# Patient Record
Sex: Female | Born: 1950
Health system: Southern US, Community
[De-identification: ages and names within clinical notes are randomized; demographics above are authoritative.]

## PROBLEM LIST (undated history)

## (undated) DIAGNOSIS — R569 Unspecified convulsions: Secondary | ICD-10-CM

## (undated) DIAGNOSIS — G40209 Localization-related (focal) (partial) symptomatic epilepsy and epileptic syndromes with complex partial seizures, not intractable, without status epilepticus: Secondary | ICD-10-CM

## (undated) DIAGNOSIS — I639 Cerebral infarction, unspecified: Secondary | ICD-10-CM

## (undated) DIAGNOSIS — M1990 Unspecified osteoarthritis, unspecified site: Secondary | ICD-10-CM

## (undated) DIAGNOSIS — M79609 Pain in unspecified limb: Secondary | ICD-10-CM

## (undated) DIAGNOSIS — G479 Sleep disorder, unspecified: Secondary | ICD-10-CM

## (undated) DIAGNOSIS — I10 Essential (primary) hypertension: Secondary | ICD-10-CM

## (undated) HISTORY — DX: Essential (primary) hypertension: I10

## (undated) HISTORY — DX: Cerebral infarction, unspecified: I63.9

## (undated) HISTORY — PX: NO PAST SURGERIES: SHX2092

## (undated) HISTORY — DX: Unspecified convulsions: R56.9

## (undated) HISTORY — DX: Sleep disorder, unspecified: G47.9

## (undated) HISTORY — DX: Localization-related (focal) (partial) symptomatic epilepsy and epileptic syndromes with complex partial seizures, not intractable, without status epilepticus: G40.209

## (undated) HISTORY — DX: Pain in unspecified limb: M79.609

---

## 2001-03-22 ENCOUNTER — Inpatient Hospital Stay (HOSPITAL_COMMUNITY): Admission: EM | Admit: 2001-03-22 | Discharge: 2001-03-24 | Payer: Self-pay | Admitting: Emergency Medicine

## 2001-03-22 ENCOUNTER — Encounter: Payer: Self-pay | Admitting: Neurology

## 2004-04-10 ENCOUNTER — Ambulatory Visit: Payer: Self-pay | Admitting: Internal Medicine

## 2012-05-15 ENCOUNTER — Telehealth: Payer: Self-pay | Admitting: Nurse Practitioner

## 2012-05-23 ENCOUNTER — Telehealth: Payer: Self-pay | Admitting: Neurology

## 2012-05-29 NOTE — Telephone Encounter (Signed)
error 

## 2012-07-27 ENCOUNTER — Encounter: Payer: Self-pay | Admitting: Neurology

## 2012-07-27 ENCOUNTER — Ambulatory Visit (INDEPENDENT_AMBULATORY_CARE_PROVIDER_SITE_OTHER): Payer: 59 | Admitting: Neurology

## 2012-07-27 VITALS — BP 181/87 | HR 50 | Temp 98.0°F | Ht 65.0 in | Wt 203.0 lb

## 2012-07-27 DIAGNOSIS — G40909 Epilepsy, unspecified, not intractable, without status epilepticus: Secondary | ICD-10-CM

## 2012-07-27 MED ORDER — CARBAMAZEPINE ER 300 MG PO CP12: 600.0000 mg | ORAL_CAPSULE | Freq: Two times a day (BID) | ORAL | Status: DC

## 2012-07-27 MED ORDER — LEVETIRACETAM 750 MG PO TABS: 750.0000 mg | ORAL_TABLET | Freq: Two times a day (BID) | ORAL | Status: DC

## 2012-07-27 NOTE — Progress Notes (Signed)
Subjective:    Patient ID: Tracy Lawson is a 62 y.o. female.  HPI  Interim history:     Tracy Lawson is a very pleasant 62 year old right-handed woman who presents for followup consultation of her seizure disorder. She is unaccompanied today. She previously followed with Dr. Venia Carbon and was last seen by him on 09/14/2011, at which time he felt that she was stable and suggested a one-year followup. She developed seizures in 1993 after the birth of her first child. She was first seen at [facility] and he didn't, [place] and was placed on Dilantin but this caused gum swelling and she was switched to Tegretol. She had side effects on Tegretol including sleepiness and lightheadedness and poor Sz control. Her seizures as simple partial, complex partial and secondarily generalized seizures. Her last generalized event was in 1995 per Dr. Imagene Gurney report. Keppra was added in 2002. Her last MRI brain with and without contrast in March 2003 was normal. Last MR a brain from March 2003 was normal. Her last EEG was in September 2004. She has an underlying medical history of hypertension, hyperlipidemia and seizures. She has had no major surgeries. She's currently on spironolactone, atenolol, Norvasc, aspirin, clonidine, furosemide, Lipitor, Klor-Con, Keppra 750 mg twice daily calcium and vitamin D and Carbatrol 600 mg twice daily. She has no new complaints today. She denies any recent illness, aura, seizure, problems with her medication or side effects.  Her Past Medical History Is Significant For: Past Medical History  Diagnosis Date  . Pain in limb   . Sleep disturbance, unspecified   . Unspecified essential hypertension   . Localization-related (focal) (partial) epilepsy and epileptic syndromes with complex partial seizures, without mention of intractable epilepsy     Her Past Surgical History Is Significant For: History reviewed. No pertinent past surgical history.  Her Family History Is  Significant For: Family History  Problem Relation Age of Onset  . Stroke Father   . Heart failure Mother     Her Social History Is Significant For: History   Social History  . Marital Status: Married    Spouse Name: N/A    Number of Children: N/A  . Years of Education: N/A   Social History Main Topics  . Smoking status: Former Smoker    Types: Cigarettes    Quit date: 07/27/1992  . Smokeless tobacco: None  . Alcohol Use: No  . Drug Use: No  . Sexually Active: None   Other Topics Concern  . None   Social History Narrative  . None    Her Allergies Are:  No Known Allergies:   Her Current Medications Are:  Outpatient Encounter Prescriptions as of 07/27/2012  Medication Sig Dispense Refill  . amLODipine (NORVASC) 10 MG tablet Take 10 mg by mouth daily.      Marland Kitchen aspirin 81 MG tablet Take 81 mg by mouth daily.      Marland Kitchen atenolol (TENORMIN) 100 MG tablet Take 100 mg by mouth 2 (two) times daily.      Marland Kitchen atorvastatin (LIPITOR) 20 MG tablet Take 20 mg by mouth daily.      . Calcium Carbonate-Vitamin D (CALCIUM + D PO) Take 1 tablet by mouth daily.      . carbamazepine (CARBATROL) 300 MG 12 hr capsule       . cloNIDine (CATAPRES) 0.1 MG tablet Take 0.1 mg by mouth 2 (two) times daily.      . furosemide (LASIX) 80 MG tablet Take 80 mg  by mouth 2 (two) times daily.      Marland Kitchen levETIRAcetam (KEPPRA) 750 MG tablet Take 750 mg by mouth every 12 (twelve) hours.      . potassium chloride SA (K-DUR,KLOR-CON) 20 MEQ tablet Take 20 mEq by mouth daily.      Marland Kitchen spironolactone (ALDACTONE) 50 MG tablet Take 50 mg by mouth daily.       No facility-administered encounter medications on file as of 07/27/2012.  :   Review of Systems  Constitutional: Positive for unexpected weight change (gain).    Objective:  Neurologic Exam  Physical Exam Physical Examination:   Filed Vitals:   07/27/12 1447  BP: 181/87  Pulse: 50  Temp: 98 F (36.7 C)    General Examination: The patient is a very  pleasant 62 y.o. female in no acute distress. She appears well-developed and well-nourished and very well groomed.   HEENT: Normocephalic, atraumatic, pupils are equal, round and reactive to light and accommodation. Extraocular tracking is good without limitation to gaze excursion or nystagmus noted. Normal smooth pursuit is noted. Hearing is grossly intact. Face is symmetric with normal facial animation and normal facial sensation. Speech is clear with no dysarthria noted. There is no hypophonia. There is no lip, neck/head, jaw or voice tremor. Neck is supple with full range of passive and active motion. There are no carotid bruits on auscultation. Oropharynx exam reveals: mild mouth dryness, good dental hygiene and mild airway crowding. Mallampati is class II. Tongue protrudes centrally and palate elevates symmetrically.   Chest: Clear to auscultation without wheezing, rhonchi or crackles noted.  Heart: S1+S2+0, regular and normal without murmurs, rubs or gallops noted.   Abdomen: Soft, non-tender and non-distended with normal bowel sounds appreciated on auscultation.  Extremities: There is no pitting edema in the distal lower extremities bilaterally. Pedal pulses are intact.  Skin: Warm and dry without trophic changes noted. There are no varicose veins.  Musculoskeletal: exam reveals no obvious joint deformities, tenderness or joint swelling or erythema.   Neurologically:  Mental status: The patient is awake, alert and oriented in all 4 spheres. Her memory, attention, language and knowledge are appropriate. There is no aphasia, agnosia, apraxia or anomia. Speech is clear with normal prosody and enunciation. Thought process is linear. Mood is congruent and affect is normal.  Cranial nerves are as described above under HEENT exam. In addition, shoulder shrug is normal with equal shoulder height noted. Motor exam: Normal bulk, strength and tone is noted. There is no drift, tremor or rebound.  Romberg is negative. Reflexes are 2+ throughout. Toes are downgoing bilaterally. Fine motor skills are intact with normal finger taps, normal hand movements, normal rapid alternating patting, normal foot taps and normal foot agility.  Cerebellar testing shows no dysmetria or intention tremor on finger to nose testing. Heel to shin is unremarkable bilaterally. There is no truncal or gait ataxia.  Sensory exam is intact to light touch, pinprick, vibration, temperature sense and proprioception in the upper and lower extremities.  Gait, station and balance are unremarkable. No veering to one side is noted. No leaning to one side is noted. Posture is age-appropriate and stance is narrow based. No problems turning are noted. She turns en bloc. Tandem walk is unremarkable. Intact toe and heel stance is noted.               Assessment and Plan:  Assessment and Plan:  In summary, Tracy Lawson is a very pleasant 62 y.o.-year old female with  a history of seizure disorder, including complex partial, simple partial and secondary generalized seizures. She has been stable on a 2 AED regimen without SE. She is advised to follow up in 12 months with one of my associates. I explained to her that we are trying to get Dr. Imagene Gurney patients accommodated the best we can according to the patient's diagnoses. My field of expertise his movement disorders of Parkinson's disease and she understands that we all have different levels of expertise in different areas of neurology in our group. To that end we will have our nurse, Hermenia Fiscal, call her for her next appointment. I did suggest some blood work and renewed her prescriptions today.  I answered all her questions today and the patient was in agreement with the above outlined plan. She is encouraged to call with any interim questions, concerns, or problems.

## 2012-07-27 NOTE — Patient Instructions (Addendum)
I think overall you are doing fairly well and are stable at this point.   I do have some generic suggestions for you today:  Please make sure that you drink plenty of fluids. I would like for you to exercise daily for example in the form of walking 20-30 minutes every day, if you can. Please keep a regular sleep-wake schedule, keep regular meal times, do not skip any meals, eat  healthy snacks in between meals, such as fruit or nuts. Try to eat protein with every meal.   As far as your medications are concerned, I would like to suggest: no changes. I have renewed your prescriptions.    As far as diagnostic testing, I recommend: CBC and chemistry today. We will call you for tests results.   I do not think we need to make any changes in your medications at this point. I think you're stable enough that we can see you back in 6 months. We will have our nurse Hermenia Fiscal, call you to make her next appointment with one of my associates for your seizure disorder.  Our phone number is [phone]. We also have an after hours call service for urgent matters and there is a physician on-call for urgent questions. For any emergencies you know to call 911 or go to the nearest emergency room.

## 2012-07-28 LAB — CBC WITH DIFFERENTIAL
Basos: 0 % (ref 0–3)
Eos: 0 % (ref 0–5)
HCT: 38.4 % (ref 34.0–46.6)
Hemoglobin: 13.1 g/dL (ref 11.1–15.9)
Lymphocytes Absolute: 1.8 10*3/uL (ref 0.7–3.1)
Lymphs: 41 % (ref 14–46)
MCV: 88 fL (ref 79–97)
Monocytes: 10 % (ref 4–12)
Neutrophils Absolute: 2.2 10*3/uL (ref 1.4–7.0)
RDW: 13.5 % (ref 12.3–15.4)
WBC: 4.4 10*3/uL (ref 3.4–10.8)

## 2012-07-28 LAB — COMPREHENSIVE METABOLIC PANEL
Albumin/Globulin Ratio: 1.4 (ref 1.1–2.5)
Albumin: 4.2 g/dL (ref 3.6–4.8)
Alkaline Phosphatase: 115 IU/L (ref 39–117)
BUN/Creatinine Ratio: 17 (ref 11–26)
BUN: 15 mg/dL (ref 8–27)
Chloride: 103 mmol/L (ref 97–108)
Creatinine, Ser: 0.9 mg/dL (ref 0.57–1.00)
GFR calc Af Amer: 80 mL/min/{1.73_m2} (ref >59)
GFR calc non Af Amer: 69 mL/min/{1.73_m2} (ref >59)
Globulin, Total: 2.9 g/dL (ref 1.5–4.5)
Glucose: 95 mg/dL (ref 65–99)
Total Bilirubin: 0.2 mg/dL (ref 0.0–1.2)
Total Protein: 7.1 g/dL (ref 6.0–8.5)

## 2012-07-28 NOTE — Progress Notes (Signed)
Quick Note:  Please advise patient that her CBC and CMP were normal. ______

## 2012-07-31 NOTE — Progress Notes (Signed)
Quick Note:  Left message for patient with normal CBC and CMP, per [name]. ______

## 2012-08-02 ENCOUNTER — Telehealth: Payer: Self-pay | Admitting: Neurology

## 2012-12-18 DIAGNOSIS — Z0289 Encounter for other administrative examinations: Secondary | ICD-10-CM

## 2013-02-06 ENCOUNTER — Encounter: Payer: Self-pay | Admitting: Neurology

## 2013-02-06 ENCOUNTER — Encounter (INDEPENDENT_AMBULATORY_CARE_PROVIDER_SITE_OTHER): Payer: Self-pay

## 2013-02-06 ENCOUNTER — Ambulatory Visit (INDEPENDENT_AMBULATORY_CARE_PROVIDER_SITE_OTHER): Payer: 59 | Admitting: Neurology

## 2013-02-06 VITALS — BP 140/90 | HR 88 | Resp 17 | Ht 66.0 in | Wt 198.0 lb

## 2013-02-06 DIAGNOSIS — G40209 Localization-related (focal) (partial) symptomatic epilepsy and epileptic syndromes with complex partial seizures, not intractable, without status epilepticus: Secondary | ICD-10-CM

## 2013-02-06 DIAGNOSIS — G40309 Generalized idiopathic epilepsy and epileptic syndromes, not intractable, without status epilepticus: Secondary | ICD-10-CM | POA: Insufficient documentation

## 2013-02-06 DIAGNOSIS — G40109 Localization-related (focal) (partial) symptomatic epilepsy and epileptic syndromes with simple partial seizures, not intractable, without status epilepticus: Secondary | ICD-10-CM

## 2013-02-06 DIAGNOSIS — G40802 Other epilepsy, not intractable, without status epilepticus: Secondary | ICD-10-CM

## 2013-02-06 DIAGNOSIS — Z5181 Encounter for therapeutic drug level monitoring: Secondary | ICD-10-CM

## 2013-02-06 DIAGNOSIS — G40909 Epilepsy, unspecified, not intractable, without status epilepticus: Secondary | ICD-10-CM

## 2013-02-06 LAB — CBC WITH DIFFERENTIAL/PLATELET
BASOS ABS: 0 10*3/uL (ref 0.0–0.2)
Basos: 0 %
Eos: 0 %
Eosinophils Absolute: 0 10*3/uL (ref 0.0–0.4)
HCT: 38.7 % (ref 34.0–46.6)
HEMOGLOBIN: 13.5 g/dL (ref 11.1–15.9)
LYMPHS: 42 %
Lymphocytes Absolute: 1.9 10*3/uL (ref 0.7–3.1)
MCH: 29.9 pg (ref 26.6–33.0)
MCHC: 34.9 g/dL (ref 31.5–35.7)
MCV: 86 fL (ref 79–97)
MONOCYTES: 9 %
Monocytes Absolute: 0.4 10*3/uL (ref 0.1–0.9)
NEUTROS ABS: 2.2 10*3/uL (ref 1.4–7.0)
NEUTROS PCT: 49 %
RBC: 4.52 x10E6/uL (ref 3.77–5.28)
RDW: 13.4 % (ref 12.3–15.4)
WBC: 4.5 10*3/uL (ref 3.4–10.8)

## 2013-02-06 LAB — BASIC METABOLIC PANEL
BUN/Creatinine Ratio: 16 (ref 11–26)
BUN: 19 mg/dL (ref 8–27)
CALCIUM: 9.4 mg/dL (ref 8.7–10.3)
CO2: 32 mmol/L — AB (ref 18–29)
CREATININE: 1.16 mg/dL — AB (ref 0.57–1.00)
Chloride: 102 mmol/L (ref 96–108)
GFR calc Af Amer: 59 mL/min/{1.73_m2} — ABNORMAL LOW (ref >59)
GFR calc non Af Amer: 51 mL/min/{1.73_m2} — ABNORMAL LOW (ref >59)
Glucose: 96 mg/dL (ref 65–99)
Potassium: 3.8 mmol/L (ref 3.5–5.2)
SODIUM: 143 mmol/L (ref 134–144)

## 2013-02-06 MED ORDER — LEVETIRACETAM 750 MG PO TABS: 750.0000 mg | ORAL_TABLET | Freq: Two times a day (BID) | ORAL | Status: DC

## 2013-02-06 MED ORDER — CARBAMAZEPINE ER 300 MG PO CP12: 600.0000 mg | ORAL_CAPSULE | Freq: Two times a day (BID) | ORAL | Status: DC

## 2013-02-06 NOTE — Progress Notes (Signed)
Subjective:    Patient ID: Tracy Lawson is a 63 y.o. female.  HPI: Is a long established patient of [name].  She was last seen in the office by my colleague, [name] on 07-27-12 after Dr. Imagene Gurney retirement. At the time of her presentation she had a fairly high blood pressure of 181/87 mmHg with a low pulse rate of 50. The patient is on clonidine,  but her main neurologic complaint is a long-standing seizure disorder which has been well controlled with medications. She is unaccompanied today as the seizures were described as simple partial at onset with some complex secondary generalization. Her first seizure was in 1993. The seizure occurred right after the birth of her second child, at 2 months gestational age. The pregnancy was complicated by toxemia and PRES. Initially treated with phenytoin she developed gum swelling and was switched to carbamazepine under which he become very sleepy, ataxic unsteady in her gait. They were  seizures while  on the medication, but she describes as pronounced by myoclonic twitches and speech arrest. Under her current regimen of Keppra  and carbamazepine,  she feels she's doing very well, she does however have a history of poorly controlled hypertension in the past. No aura , diplopia and no known hyponatremia.   She has no new complaints today. She denies any recent illness, aura, seizure, problems with her medication or side effects.  There is a new entry to her family history , her sister died of a Stroke in decemeber [address] , at age 52, her younger sister had a stroke, but is cared for at home.     Last Note , visit with [name].    Tracy Lawson is a very pleasant 63 year old right-handed woman who presents for followup consultation of her seizure disorder. She is unaccompanied today. She previously followed with [name] and was last seen by him on 09/14/2011, at which time he felt that she was stable and suggested a one-year followup.  She  developed seizures in 1993 after the birth of her first child. She was first seen at [facility] and was placed on Dilantin but this caused gum swelling and she was switched to Tegretol. She had side effects on Tegretol including sleepiness and lightheadedness and poor Sz control. Her seizures as simple partial, complex partial and secondarily generalized seizures. Her last generalized event was in 1995 per Dr. Imagene Gurney report. Keppra was added in 2002.  Her last MRI brain with and without contrast in March 2003 was normal. Last MR a brain from March 2003 was normal. Her last EEG was in September 2004. She has an underlying medical history of hypertension, hyperlipidemia and seizures.   She has had no major surgeries.  She's currently on spironolactone, atenolol, Norvasc, aspirin, clonidine, furosemide, Lipitor, Klor-Con, Keppra 750 mg twice daily calcium and vitamin D and Carbatrol 600 mg twice daily.   Her Past Medical History Is Significant For: Past Medical History  Diagnosis Date  . Pain in limb   . Sleep disturbance, unspecified   . Unspecified essential hypertension   . Localization-related (focal) (partial) epilepsy and epileptic syndromes with complex partial seizures, without mention of intractable epilepsy   . Seizures     Her Past Surgical History Is Significant For: History reviewed. No pertinent past surgical history.  Her Family History Is Significant For: Family History  Problem Relation Age of Onset  . Stroke Father   . Heart failure Mother   .  Stroke Sister     Her Social History Is Significant For: History   Social History  . Marital Status: Married    Spouse Name: Chrissie NoaWilliam    Number of Children: 2  . Years of Education: 14   Social History Main Topics  . Smoking status: Former Smoker    Types: Cigarettes    Quit date: 07/27/1992  . Smokeless tobacco: Never Used  . Alcohol Use: No  . Drug Use: No  . Sexual Activity: None   Other Topics  Concern  . None   Social History Narrative   Patient is married Chrissie Noa(William) and lives with her husband, foster children and adopted children.   Patient is retired.   Patient has a college education.   Patient is right-handed.   Patient does not drink any caffeine.   Patient has two children.    Her Allergies Are:  No Known Allergies:   Her Current Medications Are:  Outpatient Encounter Prescriptions as of 02/06/2013  Medication Sig  . aspirin 81 MG tablet Take 81 mg by mouth daily.  Marland Kitchen. atenolol (TENORMIN) 100 MG tablet Take 100 mg by mouth 2 (two) times daily.  Marland Kitchen. atorvastatin (LIPITOR) 20 MG tablet Take 20 mg by mouth daily.  . carbamazepine (CARBATROL) 300 MG 12 hr capsule Take 2 capsules (600 mg total) by mouth 2 (two) times daily.  . cloNIDine (CATAPRES) 0.1 MG tablet Take 0.1 mg by mouth 2 (two) times daily.  . furosemide (LASIX) 80 MG tablet Take 80 mg by mouth 2 (two) times daily.  Marland Kitchen. levETIRAcetam (KEPPRA) 750 MG tablet Take 1 tablet (750 mg total) by mouth every 12 (twelve) hours.  . potassium chloride SA (K-DUR,KLOR-CON) 20 MEQ tablet Take 20 mEq by mouth daily.  Marland Kitchen. spironolactone (ALDACTONE) 50 MG tablet Take 50 mg by mouth daily.  . [DISCONTINUED] carbamazepine (CARBATROL) 300 MG 12 hr capsule Take 2 capsules (600 mg total) by mouth 2 (two) times daily.  . [DISCONTINUED] levETIRAcetam (KEPPRA) 750 MG tablet Take 1 tablet (750 mg total) by mouth every 12 (twelve) hours.  . [DISCONTINUED] amLODipine (NORVASC) 10 MG tablet Take 10 mg by mouth daily.  . [DISCONTINUED] Calcium Carbonate-Vitamin D (CALCIUM + D PO) Take 1 tablet by mouth daily.  :   Review of Systems  Constitutional: Positive for unexpected weight change (gain).    Objective:  Neurologic Exam  Physical Exam Physical Examination:   Filed Vitals:   02/06/13 1325  BP: 140/90  Pulse: 88  Resp: 17    General Examination: The patient is a very pleasant 63 y.o. female in no acute distress. She appears  well-developed and well-nourished and very well groomed.   HEENT:  Chest: Clear to auscultation without wheezing, rhonchi or crackles noted.  Heart: S1+S2+0, regular and normal without murmurs, rubs or gallops noted.   Abdomen: Soft, non-tender and non-distended with normal bowel sounds appreciated on auscultation.  Extremities: There is no pitting edema in the distal lower extremities bilaterally. Pedal pulses are intact.  Skin: Warm and dry without trophic changes noted. There are no varicose veins.  Musculoskeletal: exam reveals no obvious joint deformities, tenderness or joint swelling or erythema.   Neurologically:  Mental status: The patient is awake, alert and oriented in all 4 spheres. Her memory, attention, language and knowledge are appropriate. There is no aphasia, agnosia, apraxia or anomia. Speech is clear with normal prosody and enunciation. Thought process is linear. Mood is congruent and affect is normal.  GDS is endorsed at 1  point.   Cranial nerves :Normocephalic, atraumatic, pupils are equal, round and reactive to light and accommodation. Extraocular tracking is good without limitation to gaze excursion , and there is no  nystagmus noted. No diplopia.  Normal smooth pursuit is noted. There is ptosis on the left, the left is set deeper, no esotropia,   Hearing is grossly intact. Face is symmetric with normal facial animation and normal facial sensation. Speech is clear with no dysarthria noted. There is no hypophonia. There is no lip, neck/head, jaw or voice tremor. Neck is supple with full range of passive and active motion. There are no carotid bruits on auscultation. Oropharynx exam reveals: mild mouth dryness, good dental hygiene and mild airway crowding. Mallampati is class II. Tongue protrudes centrally and palate elevates symmetrically.   In addition, shoulder shrug is normal with equal shoulder height noted.  Motor exam: Normal bulk, strength and tone is noted. There  is no drift, tremor or rebound. Romberg is negative. Reflexes are 2+ throughout.  Toes are downgoing bilaterally. Fine motor skills are intact with normal finger taps.  Cerebellar testing shows no dysmetria or intention tremor on finger to nose testing.  Heel to shin is unremarkable bilaterally. There is no truncal or gait ataxia.   Sensory exam is intact to light touch, pinprick, vibration, temperature sense and proprioception in the upper and lower extremities.   Gait, station and balance are unremarkable. No veering to one side is noted. No leaning to one side is noted. Posture is age-appropriate and stance is narrow based. No problems turning are noted. She turns en bloc. Tandem walk is unremarkable. Intact toe and heel stance is noted.               Assessment and Plan:  Assessment and Plan:   In summary, MARIJA CALAMARI is a very pleasant 63 y.o.-year old female with a history of seizure disorder, including complex partial, simple partial and secondary generalized seizures. She has been stable on a 2 AED regimen without SE. She is advised to follow up in 12 months .   I will refill today her anti seizure medications Keppra 750 mg bid  And carbamazepine 200 mg bid.   and obtain a sodium level and CBC, advised her to call if diplopia, vertigo or ataxia are noted or if she feels confused, disoriented to call her PCP or [place] for Chem 7.

## 2013-02-06 NOTE — Patient Instructions (Signed)
  This  patient had temporal lobe seizures. These can correspond with his mesial temporal sclerosis, leading to atrophy of the hippocampus. Her last MRI report has not commented on this possible finding. She had no epileptic seizures for over 10 years. She therefore does not need an EEG or MRI at this point.   Epilepsy People with epilepsy have times when they shake and jerk uncontrollably (seizures). This happens when there is a sudden change in brain function. Epilepsy may have many possible causes. Anything that disturbs the normal pattern of brain cell activity can lead to seizures. HOME CARE   Follow your doctor's instructions about driving and safety during normal activities.  Get enough sleep.  Only take medicine as told by your doctor.  Avoid things that you know can cause you to have seizures (triggers).  Write down when your seizures happen and what you remember about each seizure. Write down anything you think may have caused the seizure to happen.  Tell the people you live and work with that you have seizures. Make sure they know how to help you. They should:  Cushion your head and body.  Turn you on your side.  Not restrain you.  Not place anything inside your mouth.  Call for local emergency medical help if there is any question about what has happened.  Keep all follow-up visits with your doctor. This is very important. GET HELP IF:  You get an infection or start to feel sick. You may have more seizures when you are sick.  You are having seizures more often.  Your seizure pattern is changing. GET HELP RIGHT AWAY IF:   A seizure does not stop after a few seconds or minutes.  A seizure causes you to have trouble breathing.  A seizure gives you a very bad headache.  A seizure makes you unable to speak or use a part of your body. Document Released: 10/25/2008 Document Revised: 10/18/2012 Document Reviewed: 08/09/2012 [facility] Patient Information 2014  [place], [place].

## 2013-02-08 ENCOUNTER — Telehealth: Payer: Self-pay | Admitting: Neurology

## 2013-02-08 NOTE — Telephone Encounter (Signed)
States she had labs and missed a call. Nothing documented in the system please call with results

## 2013-02-08 NOTE — Progress Notes (Signed)
Quick Note:  I called and LMVM for pt re: results of labs. Will fax to Dr. Sherryll BurgerShah , pcp re; kidney function tests. She is to call back for more questions. ______

## 2013-02-09 NOTE — Telephone Encounter (Signed)
I spoke to pt and gave her the results of labs per Dr. Vickey Hugerohmeier regarding her kidney function tests.  She stated she has an appt next Friday with Dr. Sherryll BurgerShah.  I mailed copy to her, and faxed to Dr. Elicia LampShah ofv note and lab results.

## 2013-07-26 ENCOUNTER — Telehealth: Payer: Self-pay | Admitting: Neurology

## 2013-07-26 NOTE — Telephone Encounter (Signed)
Patient had a previous note written on 09/16/11(note in [facility], signed by Dr Sandria ManlyLove).

## 2013-07-26 NOTE — Telephone Encounter (Signed)
Given to donna, and decided to renew note.

## 2013-07-26 NOTE — Telephone Encounter (Signed)
Patient needs re certification for [facility].  Requesting a note stating she has no limitations/or restrictions due to her dx's.  Please call and advise.

## 2013-07-30 NOTE — Telephone Encounter (Signed)
Left message that the doctor has agreed to write the note, just need to know if it needs to be addressed to a specific person.

## 2013-07-31 NOTE — Telephone Encounter (Signed)
Patient returned call and stated letter could be addressed to whom it may concern.  Questioning how much she would have to pay for letter.  Please call and advise.

## 2013-08-01 ENCOUNTER — Encounter: Payer: Self-pay | Admitting: Neurology

## 2013-08-02 NOTE — Telephone Encounter (Signed)
Letter is completed and given to medical records to collect payment and notify patient.

## 2013-08-06 DIAGNOSIS — Z0289 Encounter for other administrative examinations: Secondary | ICD-10-CM

## 2013-08-14 ENCOUNTER — Other Ambulatory Visit: Payer: Self-pay | Admitting: Neurology

## 2013-11-09 ENCOUNTER — Telehealth: Payer: Self-pay | Admitting: Nurse Practitioner

## 2013-11-09 NOTE — Telephone Encounter (Signed)
Correction: 02/06/14 appointment.  °

## 2013-11-09 NOTE — Telephone Encounter (Signed)
Left message for patient on both contact numbers regarding rescheduling 02/06/13 appointment per Carolyn's schedule.  °

## 2014-02-06 ENCOUNTER — Ambulatory Visit: Payer: 59 | Admitting: Nurse Practitioner

## 2014-02-07 ENCOUNTER — Ambulatory Visit (INDEPENDENT_AMBULATORY_CARE_PROVIDER_SITE_OTHER): Payer: 59 | Admitting: Nurse Practitioner

## 2014-02-07 ENCOUNTER — Encounter: Payer: Self-pay | Admitting: Nurse Practitioner

## 2014-02-07 VITALS — BP 220/110 | HR 61 | Ht 66.0 in | Wt 197.0 lb

## 2014-02-07 DIAGNOSIS — G40909 Epilepsy, unspecified, not intractable, without status epilepticus: Secondary | ICD-10-CM

## 2014-02-07 DIAGNOSIS — Z5181 Encounter for therapeutic drug level monitoring: Secondary | ICD-10-CM

## 2014-02-07 DIAGNOSIS — G40209 Localization-related (focal) (partial) symptomatic epilepsy and epileptic syndromes with complex partial seizures, not intractable, without status epilepticus: Secondary | ICD-10-CM

## 2014-02-07 DIAGNOSIS — I10 Essential (primary) hypertension: Secondary | ICD-10-CM

## 2014-02-07 DIAGNOSIS — G40309 Generalized idiopathic epilepsy and epileptic syndromes, not intractable, without status epilepticus: Secondary | ICD-10-CM

## 2014-02-07 NOTE — Patient Instructions (Addendum)
Your B/P is elevated today 230/102 234/100 and 200/100 in left arm (5 min increments) Right arm 180/96 Continue Carbatrol at current dose will refill Will check carbamazepine level today Continue Keppra at current dose will refill Monitor your blood pressure for the next couple of days and try to get appt with  Dr. Clelia CroftShaw  Do not miss any doses of your blood pressure medicine If you have headache or  Dizziness or stroke like symptoms  go to the emergency room F/U yearly for seizure disorder

## 2014-02-07 NOTE — Progress Notes (Signed)
[facility]  PATIENT: Tracy Lawson DOB: [birth year]   REASON FOR VISIT: Follow-up for seizure disorder  HISTORY FROM:patient    HISTORY OF PRESENT ILLNESS:Tracy Lawson is a very pleasant 64 year old right-handed woman who presents for followup consultation of her seizure disorder. She was last seen 02/06/2013 by Dr. Vickey Hugerohmeier. She is unaccompanied today. She previously followed with Dr. Avie EchevariaJames Love and was last seen by him on 09/14/2011.  She developed seizures in 1993 after the birth of her first child. [facility] and was placed on Dilantin but this caused gum swelling and she was switched to Tegretol. She had side effects on Tegretol including sleepiness and lightheadedness and poor Sz control. Her seizures as simple partial, complex partial and secondarily generalized seizures. Her last generalized event was in 1995 per Dr. Imagene GurneyLove's report. Keppra was added in 2002.  Her last MRI brain with and without contrast in March 2003 was normal.  Her last EEG was in September 2004 and is not in our old EMR. . She has an underlying medical history of hypertension, hyperlipidemia and seizures.  She has had no major surgeries.  She's currently on spironolactone, atenolol, Norvasc,  clonidine, furosemide, for her HTN her blood pressure is elevated in the office today to 230/102. No seizure activity in many years currently controlled on Keppra 750 mg twice daily and Carbatrol 600 mg twice daily. She tells me that her sister died of stroke. She denies any increased dizziness or headache with her elevated blood pressure. She has not taken her blood pressure in many weeks. She denies any weakness numbness change in vision or speech increased confusion, facial asymmetry. She returns for reevaluation   HPI: Is a long established patient of Dr. Avie EchevariaJames Love.  She was last seen in the office by my colleague, Dr. Frances FurbishAthar on 07-27-12 after Dr. Imagene GurneyLove's  retirement. At the time of her presentation she had a fairly high blood pressure of 181/87 mmHg with a low pulse rate of 50. The patient is on clonidine, but her main neurologic complaint is a long-standing seizure disorder which has been well controlled with medications. She is unaccompanied today as the seizures were described as simple partial at onset with some complex secondary generalization. Her first seizure was in 1993. The seizure occurred right after the birth of her second child, at 688 months gestational age. The pregnancy was complicated by toxemia and PRES. Initially treated with phenytoin she developed gum swelling and was switched to carbamazepine under which he become very sleepy, ataxic unsteady in her gait. They were seizures while on the medication, but she describes as pronounced by myoclonic twitches and speech arrest. Under her current regimen of Keppra and carbamazepine, she feels she's doing very well, she does however have a history of poorly controlled hypertension in the past. No aura , diplopia and no known hyponatremia.   She has no new complaints today. She denies any recent illness, aura, seizure, problems with her medication or side effects.  There is a new entry to her family history , her sister died of a Stroke in decemeber [address] , at age 63, her younger sister had a stroke, but is cared for at home.        REVIEW OF SYSTEMS: Full 14 system review of systems performed and notable only for those listed, all others are neg:  Constitutional: neg  Cardiovascular: neg Ear/Nose/Throat: neg  Skin: neg Eyes: neg Respiratory: neg Gastroitestinal: neg  Hematology/Lymphatic: neg  Endocrine: neg Musculoskeletal:neg Allergy/Immunology: neg Neurological: neg Psychiatric: neg Sleep : neg   ALLERGIES: No Known Allergies  HOME MEDICATIONS: Outpatient Prescriptions Prior to Visit  Medication Sig Dispense Refill  . aspirin 81 MG tablet Take 81 mg by mouth  daily.    Marland Kitchen atenolol (TENORMIN) 100 MG tablet Take 100 mg by mouth 2 (two) times daily.    Marland Kitchen atorvastatin (LIPITOR) 20 MG tablet Take 20 mg by mouth daily.    . carbamazepine (CARBATROL) 300 MG 12 hr capsule Take 2 capsules (600 mg total) by mouth 2 (two) times daily. 360 capsule 3  . cloNIDine (CATAPRES) 0.1 MG tablet Take 0.1 mg by mouth 2 (two) times daily.    . furosemide (LASIX) 80 MG tablet Take 80 mg by mouth 2 (two) times daily.    Marland Kitchen levETIRAcetam (KEPPRA) 750 MG tablet Take 1 tablet (750 mg total) by mouth every 12 (twelve) hours. 180 tablet 3  . potassium chloride SA (K-DUR,KLOR-CON) 20 MEQ tablet Take 20 mEq by mouth daily.    Marland Kitchen spironolactone (ALDACTONE) 50 MG tablet Take 50 mg by mouth daily.    . carbamazepine (CARBATROL) 300 MG 12 hr capsule TAKE TWO CAPSULES BY MOUTH TWICE DAILY 360 capsule 1   No facility-administered medications prior to visit.    PAST MEDICAL HISTORY: Past Medical History  Diagnosis Date  . Pain in limb   . Sleep disturbance, unspecified   . Unspecified essential hypertension   . Localization-related (focal) (partial) epilepsy and epileptic syndromes with complex partial seizures, without mention of intractable epilepsy   . Seizures     PAST SURGICAL HISTORY: History reviewed. No pertinent past surgical history.  FAMILY HISTORY: Family History  Problem Relation Age of Onset  . Stroke Father   . Heart failure Mother   . Stroke Sister     SOCIAL HISTORY: History   Social History  . Marital Status: Married    Spouse Name: Chrissie Noa    Number of Children: 2  . Years of Education: 14   Occupational History  . Not on file.   Social History Main Topics  . Smoking status: Former Smoker    Types: Cigarettes    Quit date: 07/27/1992  . Smokeless tobacco: Never Used  . Alcohol Use: No  . Drug Use: No  . Sexual Activity: Not on file   Other Topics Concern  . Not on file   Social History Narrative   Patient is married Chrissie Noa) and  lives with her husband, foster children and adopted children.   Patient is retired.   Patient has a college education.   Patient is right-handed.   Patient does not drink any caffeine.   Patient has two children.     PHYSICAL EXAM  Filed Vitals:   02/07/14 0929 02/07/14 0930  BP: 230/102 220/110  Pulse: 61   Height:  (1.676 m)   Weight: 197 lb (89.359 kg)    Body mass index is 31.81 kg/(m^2).  Generalized: Well developed, in no acute distress  Head: normocephalic and atraumatic,. Oropharynx benign  Neck: Supple, no carotid bruits  Cardiac: Regular rate rhythm, no murmur . Musculoskeletal: No deformity   Neurological examination   Mentation: Alert oriented to time, place, history taking. Attention span and concentration appropriate. Recent and remote memory intact.  Follows all commands speech and language fluent.   Cranial nerve II-XII: Fundoscopic exam reveals sharp disc margins.Pupils were equal round reactive to light extraocular movements were full, visual field  were full on confrontational test. Mild ptosis on the left. Facial sensation and strength were normal. hearing was intact to finger rubbing bilaterally. Uvula tongue midline. head turning and shoulder shrug were normal and symmetric.Tongue protrusion into cheek strength was normal. Motor: normal bulk and tone, full strength in the BUE, BLE, fine finger movements normal, no pronator drift. No focal weakness Sensory: normal and symmetric to light touch, pinprick, and  Vibration, proprioception  Coordination: finger-nose-finger, heel-to-shin bilaterally, no dysmetria Reflexes: Brachioradialis 2/2, biceps 2/2, triceps 2/2, patellar 2/2, Achilles 2/2, plantar responses were flexor bilaterally. Gait and Station: Rising up from seated position without assistance, normal stance,  moderate stride, good arm swing, smooth turning, able to perform tiptoe, and heel walking without difficulty. Tandem gait is steady. Romberg  negative  DIAGNOSTIC DATA (LABS, IMAGING, TESTING) - I reviewed patient records, labs, notes, testing and imaging myself where available.  Lab Results  Component Value Date   WBC 4.5 02/06/2013   HGB 13.5 02/06/2013   HCT 38.7 02/06/2013   MCV 86 02/06/2013   PLT 234 07/27/2012      Component Value Date/Time   NA 143 02/06/2013 1408   K 3.8 02/06/2013 1408   CL 102 02/06/2013 1408   CO2 32* 02/06/2013 1408   GLUCOSE 96 02/06/2013 1408   BUN 19 02/06/2013 1408   CREATININE 1.16* 02/06/2013 1408   CALCIUM 9.4 02/06/2013 1408   PROT 7.1 07/27/2012 1553   AST 22 07/27/2012 1553   ALT 16 07/27/2012 1553   ALKPHOS 115 07/27/2012 1553   BILITOT 0.2 07/27/2012 1553   GFRNONAA 51* 02/06/2013 1408   GFRAA 59* 02/06/2013 1408   ASSESSMENT AND PLAN  64 y.o. year old female  has a past medical history of  Unspecified essential hypertension; Localization-related (focal) (partial) epilepsy and epileptic syndromes with complex partial seizures, without mention of intractable epilepsy; and Seizures. here to follow-up. Her blood pressure is elevated today in the office.230/102 234/100 and 200/100 in left arm (5 min increments)Right arm 180/96 Her seizure disorder is well controlled on Keppra and carbamazepine.   Continue Carbatrol at current dose will refill Will check carbamazepine level today Continue Keppra at current dose will refill Monitor your blood pressure for the next couple of days and try to get appt with  Dr. Clelia Croft  Do not miss any doses of your blood pressure medicine If you have headache or  Dizziness or stroke like symptoms such as weakness numbness change in vision or speech increased confusion, facial asymmetry.call 911 Follow-up yearly for seizure disorder  Nilda Riggs, [facility], [facility], APRN  [facility] [address] [place], [place] [address] [phone]

## 2014-02-08 ENCOUNTER — Telehealth: Payer: Self-pay | Admitting: Nurse Practitioner

## 2014-02-08 DIAGNOSIS — G40309 Generalized idiopathic epilepsy and epileptic syndromes, not intractable, without status epilepticus: Secondary | ICD-10-CM

## 2014-02-08 DIAGNOSIS — G40209 Localization-related (focal) (partial) symptomatic epilepsy and epileptic syndromes with complex partial seizures, not intractable, without status epilepticus: Secondary | ICD-10-CM

## 2014-02-08 LAB — CARBAMAZEPINE LEVEL, TOTAL: Carbamazepine Lvl: 11.7 ug/mL (ref 4.0–12.0)

## 2014-02-08 NOTE — Telephone Encounter (Signed)
Patient is returning a call. °

## 2014-02-08 NOTE — Telephone Encounter (Signed)
-----   Message from Nilda RiggsNancy Carolyn Martin, NP sent at 02/08/2014  8:55 AM EST ----- Good level of Carbamazepine. Please call the patient

## 2014-02-08 NOTE — Telephone Encounter (Signed)
Tried calling patient on all contacts. Unable to reach. Will try later.

## 2014-02-13 NOTE — Telephone Encounter (Signed)
Sent letter

## 2014-02-13 NOTE — Progress Notes (Signed)
Quick Note:  Called and left message Carbamazepine level good. ______

## 2014-05-06 ENCOUNTER — Telehealth: Payer: Self-pay | Admitting: Neurology

## 2014-05-06 DIAGNOSIS — G40209 Localization-related (focal) (partial) symptomatic epilepsy and epileptic syndromes with complex partial seizures, not intractable, without status epilepticus: Secondary | ICD-10-CM

## 2014-05-06 DIAGNOSIS — G40109 Localization-related (focal) (partial) symptomatic epilepsy and epileptic syndromes with simple partial seizures, not intractable, without status epilepticus: Secondary | ICD-10-CM

## 2014-05-06 DIAGNOSIS — G40909 Epilepsy, unspecified, not intractable, without status epilepticus: Secondary | ICD-10-CM

## 2014-05-06 MED ORDER — CARBAMAZEPINE ER 300 MG PO CP12: 600.0000 mg | ORAL_CAPSULE | Freq: Two times a day (BID) | ORAL | Status: DC

## 2014-05-06 NOTE — Telephone Encounter (Signed)
Patient called requesting a refill for carbamazepine (CARBATROL) 300 MG 12 hr capsule. She would also like for the script to be called in to a new pharmacy. The new Pharmacy: [facility] [phone]

## 2014-05-06 NOTE — Telephone Encounter (Signed)
New pharmacy added.  Rx has been sent.  I called back to advise.  She is aware.

## 2014-06-24 ENCOUNTER — Telehealth: Payer: Self-pay | Admitting: Nurse Practitioner

## 2014-06-24 DIAGNOSIS — G40209 Localization-related (focal) (partial) symptomatic epilepsy and epileptic syndromes with complex partial seizures, not intractable, without status epilepticus: Secondary | ICD-10-CM

## 2014-06-24 DIAGNOSIS — G40909 Epilepsy, unspecified, not intractable, without status epilepticus: Secondary | ICD-10-CM

## 2014-06-24 MED ORDER — LEVETIRACETAM 750 MG PO TABS: 750.0000 mg | ORAL_TABLET | Freq: Two times a day (BID) | ORAL | Status: DC

## 2014-06-24 NOTE — Telephone Encounter (Signed)
Patient called and requested that her Rx. KEPPRA be sent to [facility], [place] - [address]. Please call and advise.

## 2014-06-24 NOTE — Telephone Encounter (Signed)
Rx has been sent.  I called back to advise.  Patient is aware.  

## 2015-02-07 ENCOUNTER — Ambulatory Visit (INDEPENDENT_AMBULATORY_CARE_PROVIDER_SITE_OTHER): Payer: Medicare Other | Admitting: Nurse Practitioner

## 2015-02-07 ENCOUNTER — Encounter: Payer: Self-pay | Admitting: Nurse Practitioner

## 2015-02-07 VITALS — BP 155/82 | HR 57 | Ht 66.0 in | Wt 198.2 lb

## 2015-02-07 DIAGNOSIS — G40909 Epilepsy, unspecified, not intractable, without status epilepticus: Secondary | ICD-10-CM | POA: Diagnosis not present

## 2015-02-07 DIAGNOSIS — G40209 Localization-related (focal) (partial) symptomatic epilepsy and epileptic syndromes with complex partial seizures, not intractable, without status epilepticus: Secondary | ICD-10-CM

## 2015-02-07 DIAGNOSIS — I10 Essential (primary) hypertension: Secondary | ICD-10-CM

## 2015-02-07 DIAGNOSIS — Z5181 Encounter for therapeutic drug level monitoring: Secondary | ICD-10-CM | POA: Diagnosis not present

## 2015-02-07 DIAGNOSIS — G40309 Generalized idiopathic epilepsy and epileptic syndromes, not intractable, without status epilepticus: Secondary | ICD-10-CM

## 2015-02-07 DIAGNOSIS — G40109 Localization-related (focal) (partial) symptomatic epilepsy and epileptic syndromes with simple partial seizures, not intractable, without status epilepticus: Secondary | ICD-10-CM

## 2015-02-07 MED ORDER — LEVETIRACETAM 750 MG PO TABS: 750.0000 mg | ORAL_TABLET | Freq: Two times a day (BID) | ORAL | Status: DC

## 2015-02-07 MED ORDER — CARBAMAZEPINE ER 300 MG PO CP12: 600.0000 mg | ORAL_CAPSULE | Freq: Two times a day (BID) | ORAL | Status: DC

## 2015-02-07 NOTE — Progress Notes (Signed)
I agree with the assessment and plan as directed by NP .The patient is known to me .   Tracy Huss, MD  

## 2015-02-07 NOTE — Progress Notes (Signed)
[facility]  PATIENT: Tracy Lawson DOB: 08-11-1950   REASON FOR VISIT: Follow-up for seizure disorder, generalized and hypertension HISTORY FROM: Patient     HISTORY OF PRESENT ILLNESS:Tracy Lawson is a very pleasant 65 year old right-handed woman who presents for followup  of her seizure disorder. She was last seen 02/07/2014.She  is unaccompanied today.   She developed seizures in 1993 after the birth of her first child. She was first seen at [facility], [facility] and was placed on Dilantin but this caused gum swelling and she was switched to Tegretol. She had side effects on Tegretol including sleepiness and lightheadedness and poor Sz control. Her seizures as simple partial, complex partial and secondarily generalized seizures. Her last generalized event was in 1995.Keppra was added in 2002.  Her last MRI brain with and without contrast in March 2003 was normal. Her last EEG was in September 2004 and is not in our old EMR. . She has an underlying medical history of hypertension, hyperlipidemia and seizures. She has had no major surgeries.   No seizure activity in many years currently controlled on Keppra 750 mg twice daily and Carbatrol 600 mg twice daily. She tells me that her sister died of stroke. Blood pressure elevated in the office today 155/82 She denies any increased dizziness or headache with her elevated blood pressure. She denies any weakness numbness change in vision or speech increased confusion, facial asymmetry. She returns for reevaluation   HPI: Is a long established patient of [name].  She was last seen in the office by my colleague, [name] on 07-27-12 after Dr. Imagene Gurney retirement. At the time of her presentation she had a fairly high blood pressure of 181/87 mmHg with a low pulse rate of 50. The patient is on clonidine, but her main neurologic complaint is a long-standing seizure disorder which has been well controlled with  medications. She is unaccompanied today as the seizures were described as simple partial at onset with some complex secondary generalization. Her first seizure was in 1993. The seizure occurred right after the birth of her second child, at 62 months gestational age. The pregnancy was complicated by toxemia and PRES. Initially treated with phenytoin she developed gum swelling and was switched to carbamazepine under which he become very sleepy, ataxic unsteady in her gait. They were seizures while on the medication, but she describes as pronounced by myoclonic twitches and speech arrest. Under her current regimen of Keppra and carbamazepine, she feels she's doing very well, she does however have a history of poorly controlled hypertension in the past. No aura , diplopia and no known hyponatremia.  She has no new complaints today. She denies any recent illness, aura, seizure, problems with her medication or side effects. There is a new entry to her family history , her sister died of a Stroke in decemeber [address] , at age 75, her younger sister had a stroke, but is cared for at home.      REVIEW OF SYSTEMS: Full 14 system review of systems performed and notable only for those listed, all others are neg:  Constitutional: neg  Cardiovascular: neg Ear/Nose/Throat: neg  Skin: neg Eyes: neg Respiratory: neg Gastroitestinal: neg  Hematology/Lymphatic: neg  Endocrine: neg Musculoskeletal:neg Allergy/Immunology: neg Neurological: neg Psychiatric: neg Sleep : neg   ALLERGIES: No Known Allergies  HOME MEDICATIONS: Outpatient Prescriptions Prior to Visit  Medication Sig Dispense Refill  . amLODipine (NORVASC) 10 MG tablet Take 10 mg by mouth daily.    Marland Kitchen  aspirin 81 MG tablet Take 81 mg by mouth daily.    Marland Kitchen atenolol (TENORMIN) 100 MG tablet Take 100 mg by mouth 2 (two) times daily.    Marland Kitchen atorvastatin (LIPITOR) 20 MG tablet Take 20 mg by mouth daily.    . carbamazepine (CARBATROL) 300 MG 12 hr  capsule Take 2 capsules (600 mg total) by mouth 2 (two) times daily. 360 capsule 2  . cloNIDine (CATAPRES) 0.1 MG tablet Take 0.1 mg by mouth 2 (two) times daily.    . furosemide (LASIX) 80 MG tablet Take 80 mg by mouth 2 (two) times daily.    Marland Kitchen levETIRAcetam (KEPPRA) 750 MG tablet Take 1 tablet (750 mg total) by mouth every 12 (twelve) hours. 180 tablet 1  . potassium chloride SA (K-DUR,KLOR-CON) 20 MEQ tablet Take 20 mEq by mouth daily.    Marland Kitchen spironolactone (ALDACTONE) 50 MG tablet Take 50 mg by mouth daily.     No facility-administered medications prior to visit.    PAST MEDICAL HISTORY: Past Medical History  Diagnosis Date  . Pain in limb   . Sleep disturbance, unspecified   . Unspecified essential hypertension   . Localization-related (focal) (partial) epilepsy and epileptic syndromes with complex partial seizures, without mention of intractable epilepsy   . Seizures (HCC)     PAST SURGICAL HISTORY: History reviewed. No pertinent past surgical history.  FAMILY HISTORY: Family History  Problem Relation Age of Onset  . Stroke Father   . Heart failure Mother   . Stroke Sister     SOCIAL HISTORY: Social History   Social History  . Marital Status: Married    Spouse Name: Chrissie Noa  . Number of Children: 2  . Years of Education: 14   Occupational History  . Not on file.   Social History Main Topics  . Smoking status: Former Smoker    Types: Cigarettes    Quit date: 07/27/1992  . Smokeless tobacco: Never Used  . Alcohol Use: No  . Drug Use: No  . Sexual Activity: Not on file   Other Topics Concern  . Not on file   Social History Narrative   Patient is married Chrissie Noa) and lives with her husband, foster children and adopted children.   Patient is retired.   Patient has a college education.   Patient is right-handed.   Patient does not drink any caffeine.   Patient has two children.     PHYSICAL EXAM  Filed Vitals:   02/07/15 0912  BP: 155/82  Pulse: 57   Height:  (1.676 m)  Weight: 198 lb 3.2 oz (89.903 kg)   Body mass index is 32.01 kg/(m^2). Generalized: Well developed, in no acute distress  Head: normocephalic and atraumatic,. Oropharynx benign  Neck: Supple, no carotid bruits  Cardiac: Regular rate rhythm, no murmur . Musculoskeletal: No deformity   Neurological examination   Mentation: Alert oriented to time, place, history taking. Attention span and concentration appropriate. Recent and remote memory intact. Follows all commands speech and language fluent.   Cranial nerve II-XII: Pupils were equal round reactive to light extraocular movements were full, visual field were full on confrontational test. Mild ptosis on the left. Facial sensation and strength were normal. hearing was intact to finger rubbing bilaterally. Uvula tongue midline. head turning and shoulder shrug were normal and symmetric.Tongue protrusion into cheek strength was normal. Motor: normal bulk and tone, full strength in the BUE, BLE, fine finger movements normal, no pronator drift. No focal weakness Sensory: normal and  symmetric to light touch, pinprick, and Vibration, proprioception  Coordination: finger-nose-finger, heel-to-shin bilaterally, no dysmetria Reflexes: Brachioradialis 2/2, biceps 2/2, triceps 2/2, patellar 2/2, Achilles 2/2, plantar responses were flexor bilaterally. Gait and Station: Rising up from seated position without assistance, normal stance, moderate stride, good arm swing, smooth turning, able to perform tiptoe, and heel walking without difficulty. Tandem gait is steady. Romberg negative   DIAGNOSTIC DATA (LABS, IMAGING, TESTING) -  ASSESSMENT AND PLAN  65 y.o. year old female  has a past medical history of essential hypertension; Localization-related (focal) (partial) epilepsy and epileptic syndromes with complex partial seizures, without mention of intractable epilepsy; and Seizures (HCC). here to follow-up. No seizures in many  years   Continue Carbatrol at current dose will refill Will check carbamazepine level today Continue Keppra at current dose will refill Follow-up yearly Call for seizure activity Nilda Riggs, [facility], [facility], APRN  [facility] [address], [address] [place], [place] [address] [phone]

## 2015-02-07 NOTE — Patient Instructions (Signed)
Continue Carbatrol at current dose will refill Will check carbamazepine level today Continue Keppra at current dose will refill Follow-up yearly

## 2015-02-08 LAB — CARBAMAZEPINE LEVEL, TOTAL: CARBAMAZEPINE LVL: 10 ug/mL (ref 4.0–12.0)

## 2015-02-10 ENCOUNTER — Telehealth: Payer: Self-pay | Admitting: *Deleted

## 2015-02-10 NOTE — Telephone Encounter (Signed)
-----   Message from Nilda Riggs, NP sent at 02/10/2015 11:37 AM EST ----- Labs look god please call the patient

## 2015-02-10 NOTE — Telephone Encounter (Signed)
LMVM for pt (dpr home # ok) that her lab results looked good.  She is to call back as needed.

## 2016-02-11 ENCOUNTER — Other Ambulatory Visit: Payer: Self-pay | Admitting: Nurse Practitioner

## 2016-02-11 DIAGNOSIS — G40109 Localization-related (focal) (partial) symptomatic epilepsy and epileptic syndromes with simple partial seizures, not intractable, without status epilepticus: Secondary | ICD-10-CM

## 2016-02-11 DIAGNOSIS — G40909 Epilepsy, unspecified, not intractable, without status epilepticus: Secondary | ICD-10-CM

## 2016-02-11 DIAGNOSIS — G40209 Localization-related (focal) (partial) symptomatic epilepsy and epileptic syndromes with complex partial seizures, not intractable, without status epilepticus: Secondary | ICD-10-CM

## 2016-02-11 NOTE — Telephone Encounter (Signed)
Fax confirmation received.  [phone]. keppra and carbamazepine.

## 2016-03-10 ENCOUNTER — Telehealth: Payer: Self-pay | Admitting: Neurology

## 2016-03-12 ENCOUNTER — Other Ambulatory Visit: Payer: Self-pay | Admitting: Nurse Practitioner

## 2016-03-12 DIAGNOSIS — G40109 Localization-related (focal) (partial) symptomatic epilepsy and epileptic syndromes with simple partial seizures, not intractable, without status epilepticus: Secondary | ICD-10-CM

## 2016-03-12 DIAGNOSIS — G40209 Localization-related (focal) (partial) symptomatic epilepsy and epileptic syndromes with complex partial seizures, not intractable, without status epilepticus: Secondary | ICD-10-CM

## 2016-03-12 DIAGNOSIS — G40909 Epilepsy, unspecified, not intractable, without status epilepticus: Secondary | ICD-10-CM

## 2016-04-01 ENCOUNTER — Encounter: Payer: Self-pay | Admitting: Nurse Practitioner

## 2016-04-01 ENCOUNTER — Ambulatory Visit (INDEPENDENT_AMBULATORY_CARE_PROVIDER_SITE_OTHER): Payer: Medicare Other | Admitting: Nurse Practitioner

## 2016-04-01 VITALS — BP 202/100 | HR 56 | Ht 66.0 in | Wt 199.0 lb

## 2016-04-01 DIAGNOSIS — G40209 Localization-related (focal) (partial) symptomatic epilepsy and epileptic syndromes with complex partial seizures, not intractable, without status epilepticus: Secondary | ICD-10-CM | POA: Insufficient documentation

## 2016-04-01 DIAGNOSIS — Z5181 Encounter for therapeutic drug level monitoring: Secondary | ICD-10-CM | POA: Insufficient documentation

## 2016-04-01 DIAGNOSIS — G40309 Generalized idiopathic epilepsy and epileptic syndromes, not intractable, without status epilepticus: Secondary | ICD-10-CM

## 2016-04-01 MED ORDER — LEVETIRACETAM 750 MG PO TABS: 750.0000 mg | ORAL_TABLET | Freq: Two times a day (BID) | ORAL | 3 refills | Status: DC

## 2016-04-01 MED ORDER — CARBAMAZEPINE ER 300 MG PO CP12: ORAL_CAPSULE | ORAL | 3 refills | Status: DC

## 2016-04-01 NOTE — Progress Notes (Signed)
I have read the note, and I agree with the clinical assessment and plan.  Crissie Aloi KEITH   

## 2016-04-01 NOTE — Progress Notes (Signed)
[facility]  PATIENT: Tracy Lawson DOB: May 13, 1950   REASON FOR VISIT: Follow-up for seizure disorder, generalized and hypertension HISTORY FROM: Patient     HISTORY OF PRESENT ILLNESS:Tracy Lawson is a very pleasant 66 year old right-handed woman who presents for yearly followup  of her seizure disorder. She developed seizures in 1993 after the birth of her first child. She was first seen at [facility], [facility] and was placed on Dilantin but this caused gum swelling and she was switched to Tegretol. She had side effects on Tegretol including sleepiness and lightheadedness and poor Sz control. Her seizures are simple partial, complex partial and secondarily generalized seizures. Her last generalized event was in 1995.Keppra was added in 2002. Her last MRI brain with and without contrast in March 2003 was normal. Her last EEG was in September 2004 and is not in our old EMR. . She has an underlying medical history of hypertension, hyperlipidemia and seizures. She has had no major surgeries.   No seizure activity in many years currently controlled on Keppra 750 mg twice daily and Carbatrol 600 mg twice daily. . Blood pressure elevated in the office today 180/80. She denies missing any doses of her hypertensive medications. She says it was normal in Tracy Lawson office last week.  She denies any increased dizziness or headache with her elevated blood pressure. She denies any weakness numbness change in vision or speech increased confusion, facial asymmetry. She returns for reevaluation   HPI: Is a long established patient of Tracy Lawson.  She was last seen in the office by my colleague, Tracy Lawson on 07-27-12 after Tracy Lawson retirement. At the time of her presentation she had a fairly high blood pressure of 181/87 mmHg with a low pulse rate of 50. The patient is on clonidine, but her main neurologic complaint is a long-standing seizure disorder which has been well  controlled with medications. She is unaccompanied today as the seizures were described as simple partial at onset with some complex secondary generalization. Her first seizure was in 1993. The seizure occurred right after the birth of her second child, at 77 months gestational age. The pregnancy was complicated by toxemia and PRES. Initially treated with phenytoin she developed gum swelling and was switched to carbamazepine under which he become very sleepy, ataxic unsteady in her gait. They were seizures while on the medication, but she describes as pronounced by myoclonic twitches and speech arrest. Under her current regimen of Keppra and carbamazepine, she feels she's doing very well, she does however have a history of poorly controlled hypertension in the past. No aura , diplopia and no known hyponatremia.  She has no new complaints today. She denies any recent illness, aura, seizure, problems with her medication or side effects. There is a new entry to her family history , her sister died of a Stroke in decemeber 200[address] , at age 85, her younger sister had a stroke, but is cared for at home.      REVIEW OF SYSTEMS: Full 14 system review of systems performed and notable only for those listed, all others are neg:  Constitutional: neg  Cardiovascular: neg Ear/Nose/Throat: neg  Skin: neg Eyes: neg Respiratory: neg Gastroitestinal: neg  Hematology/Lymphatic: neg  Endocrine: neg Musculoskeletal:neg Allergy/Immunology: neg Neurological: neg Psychiatric: neg Sleep : neg   ALLERGIES: No Known Allergies  HOME MEDICATIONS: Outpatient Medications Prior to Visit  Medication Sig Dispense Refill  . amLODipine (NORVASC) 10 MG tablet Take 10 mg by mouth  daily.    . aspirin 81 MG tablet Take 81 mg by mouth daily.    Marland Kitchen. atenolol (TENORMIN) 100 MG tablet Take 100 mg by mouth 2 (two) times daily.    Marland Kitchen. atorvastatin (LIPITOR) 20 MG tablet Take 20 mg by mouth daily.    . carbamazepine (CARBATROL)  300 MG 12 hr capsule TAKE 2 CAPSULES BY MOUTH TWICE DAILY --PT NEEDS APPOINTMENT LE 120 capsule 0  . cloNIDine (CATAPRES) 0.1 MG tablet Take 0.1 mg by mouth 2 (two) times daily.    . furosemide (LASIX) 80 MG tablet Take 40 mg by mouth 2 (two) times daily.     Marland Kitchen. levETIRAcetam (KEPPRA) 750 MG tablet TAKE 1 TABLET BY MOUTH EVERY 12 HOURS 30 tablet 0  . potassium chloride SA (K-DUR,KLOR-CON) 20 MEQ tablet Take 20 mEq by mouth daily.    Marland Kitchen. spironolactone (ALDACTONE) 50 MG tablet Take 25 mg by mouth 2 (two) times daily.      No facility-administered medications prior to visit.     PAST MEDICAL HISTORY: Past Medical History:  Diagnosis Date  . Localization-related (focal) (partial) epilepsy and epileptic syndromes with complex partial seizures, without mention of intractable epilepsy   . Pain in limb   . Seizures (HCC)   . Sleep disturbance, unspecified   . Unspecified essential hypertension     PAST SURGICAL HISTORY: History reviewed. No pertinent surgical history.  FAMILY HISTORY: Family History  Problem Relation Age of Onset  . Stroke Father   . Heart failure Mother   . Stroke Sister     SOCIAL HISTORY: Social History   Social History  . Marital status: Married    Spouse name: Tracy Lawson  . Number of children: 2  . Years of education: 14   Occupational History  . Not on file.   Social History Main Topics  . Smoking status: Former Smoker    Types: Cigarettes    Quit date: 07/27/1992  . Smokeless tobacco: Never Used  . Alcohol use No  . Drug use: No  . Sexual activity: Not on file   Other Topics Concern  . Not on file   Social History Narrative   Patient is married Tracy Noa(William) and lives with her husband, foster children and adopted children.   Patient is retired.   Patient has a college education.   Patient is right-handed.   Patient does not drink any caffeine.   Patient has two children.     PHYSICAL EXAM  Vitals:   04/01/16 1242 04/01/16 1252  BP: (!)  202/100 (!)180/80  Pulse: (!) 56   Weight: 199 lb (90.3 kg)   Height: 5\' 6"  (1.676 m)    Body mass index is 32.12 kg/m. Generalized: Well developed, Obese female in no acute distress  Head: normocephalic and atraumatic,. Oropharynx benign  Neck: Supple, no carotid bruits  Cardiac: Regular rate rhythm, no murmur . Musculoskeletal: No deformity   Neurological examination   Mentation: Alert oriented to time, place, history taking. Attention span and concentration appropriate. Recent and remote memory intact. Follows all commands speech and language fluent.   Cranial nerve II-XII: Pupils were equal round reactive to light extraocular movements were full, visual field were full on confrontational test. Mild ptosis on the left. Facial sensation and strength were normal. hearing was intact to finger rubbing bilaterally. Uvula tongue midline. head turning and shoulder shrug were normal and symmetric.Tongue protrusion into cheek strength was normal. Motor: normal bulk and tone, full strength in the BUE, BLE, fine  finger movements normal, no pronator drift. No focal weakness Sensory: normal and symmetric to light touch, pinprick, and Vibration, in the upper and lower extremities Coordination: finger-nose-finger, heel-to-shin bilaterally, no dysmetria, no tremor Reflexes: Symmetric upper and lower plantar responses were flexor bilaterally. Gait and Station: Rising up from seated position without assistance, normal stance, moderate stride, good arm swing, smooth turning, able to perform tiptoe, and heel walking without difficulty. Tandem gait is steady.   DIAGNOSTIC DATA (LABS, IMAGING, TESTING) -  ASSESSMENT AND PLAN  66 y.o. year old female  has a past medical history of essential hypertension; Localization-related (focal) (partial) epilepsy and epileptic syndromes with complex partial seizures, without mention of intractable epilepsy; and Seizures (HCC). here to follow-up. No seizures in many  years.    PLAN: Continue Carbatrol at current dose will refill  Will check CBC CMP to monitor adverse effects Carbatrol  Carbamazepine level to check for therapeutic range/toxicity  Continue Keppra at current dose will refill Follow-up yearly Call for seizure activity Nilda Riggs, [facility], [facility], APRN  [facility] [address] [place], [place] [address] [phone]

## 2016-04-01 NOTE — Patient Instructions (Signed)
Continue Carbatrol at current dose will refill Will check labs today Continue Keppra at current dose will refill Follow-up yearly Call for seizure activity

## 2016-04-01 NOTE — Progress Notes (Signed)
Fax confirmation for keppra to [facility] [phone] received.

## 2016-04-02 ENCOUNTER — Telehealth: Payer: Self-pay | Admitting: Nurse Practitioner

## 2016-04-02 DIAGNOSIS — G40209 Localization-related (focal) (partial) symptomatic epilepsy and epileptic syndromes with complex partial seizures, not intractable, without status epilepticus: Secondary | ICD-10-CM

## 2016-04-02 LAB — COMPREHENSIVE METABOLIC PANEL
ALK PHOS: 114 IU/L (ref 39–117)
ALT: 13 IU/L (ref 0–32)
AST: 15 IU/L (ref 0–40)
Albumin/Globulin Ratio: 1.2 (ref 1.2–2.2)
Albumin: 3.8 g/dL (ref 3.6–4.8)
BUN/Creatinine Ratio: 16 (ref 12–28)
BUN: 15 mg/dL (ref 8–27)
Bilirubin Total: <0.2 mg/dL (ref 0.0–1.2)
CO2: 27 mmol/L (ref 18–29)
CREATININE: 0.94 mg/dL (ref 0.57–1.00)
Calcium: 9.1 mg/dL (ref 8.7–10.3)
Chloride: 103 mmol/L (ref 96–106)
GFR calc Af Amer: 74 mL/min/{1.73_m2} (ref >59)
GFR calc non Af Amer: 64 mL/min/{1.73_m2} (ref >59)
Globulin, Total: 3.3 g/dL (ref 1.5–4.5)
Glucose: 96 mg/dL (ref 65–99)
Potassium: 4.7 mmol/L (ref 3.5–5.2)
Sodium: 144 mmol/L (ref 134–144)
Total Protein: 7.1 g/dL (ref 6.0–8.5)

## 2016-04-02 LAB — CBC WITH DIFFERENTIAL/PLATELET
BASOS ABS: 0 10*3/uL (ref 0.0–0.2)
Basos: 0 %
EOS (ABSOLUTE): 0 10*3/uL (ref 0.0–0.4)
Eos: 0 %
Hematocrit: 37.6 % (ref 34.0–46.6)
Hemoglobin: 12.6 g/dL (ref 11.1–15.9)
Immature Grans (Abs): 0 10*3/uL (ref 0.0–0.1)
Immature Granulocytes: 0 %
LYMPHS: 34 %
Lymphocytes Absolute: 1.6 10*3/uL (ref 0.7–3.1)
MCH: 29.9 pg (ref 26.6–33.0)
MCHC: 33.5 g/dL (ref 31.5–35.7)
MCV: 89 fL (ref 79–97)
Monocytes Absolute: 0.4 10*3/uL (ref 0.1–0.9)
Monocytes: 9 %
NEUTROS ABS: 2.7 10*3/uL (ref 1.4–7.0)
Neutrophils: 57 %
Platelets: 231 10*3/uL (ref 150–379)
RBC: 4.21 x10E6/uL (ref 3.77–5.28)
RDW: 14.2 % (ref 12.3–15.4)
WBC: 4.7 10*3/uL (ref 3.4–10.8)

## 2016-04-02 LAB — CARBAMAZEPINE LEVEL, TOTAL: CARBAMAZEPINE LVL: 12.6 ug/mL — AB (ref 4.0–12.0)

## 2016-04-02 MED ORDER — CARBAMAZEPINE ER 300 MG PO CP12
ORAL_CAPSULE | ORAL | Status: DC
Start: 2016-04-02 — End: 2016-04-02

## 2016-04-02 MED ORDER — CARBAMAZEPINE ER 300 MG PO CP12: ORAL_CAPSULE | ORAL | 3 refills | Status: DC

## 2016-04-02 NOTE — Telephone Encounter (Signed)
Carbamazepine level to high in conjunction with Keppra. Decrease to 1 tablet in the morning and 2 at night, repeat level in 10 days. Please call the patient

## 2016-04-02 NOTE — Telephone Encounter (Signed)
Error

## 2016-04-02 NOTE — Telephone Encounter (Signed)
Spoke to pt and relayed her lab results.   (carbamazepine 12.6).  Instructed to change her dose to 300mg  po AM and 600mg  po PM.   This will be a change from prescription faxed in yesterday.   I will call her pharmacy to let them know as well.   Pt verbalized understanding.

## 2016-04-02 NOTE — Telephone Encounter (Signed)
Sp[facility] to [employer] Electric PowerKatie, at [facility].  Relayed that new prescription with dose change.  Relayed that pt is aware.  This is change from prescription faxed last night..  She verbalized understanding.

## 2016-04-02 NOTE — Telephone Encounter (Signed)
New RX faxed

## 2017-01-29 ENCOUNTER — Other Ambulatory Visit: Payer: Self-pay | Admitting: Nurse Practitioner

## 2017-01-29 DIAGNOSIS — G40209 Localization-related (focal) (partial) symptomatic epilepsy and epileptic syndromes with complex partial seizures, not intractable, without status epilepticus: Secondary | ICD-10-CM

## 2017-04-04 ENCOUNTER — Ambulatory Visit: Payer: Medicare Other | Admitting: Nurse Practitioner

## 2017-05-06 NOTE — Progress Notes (Signed)
[facility]  PATIENT: Tracy Lawson DOB: [birth year]   REASON FOR VISIT: Follow-up for seizure disorder, generalized  HISTORY FROM: Patient     HISTORY OF PRESENT ILLNESS: UPDATE 05/09/2017 CM Tracy Lawson is a very pleasant 67 year old right-handed woman who presents for yearly followup  of her seizure disorder. She developed seizures in 1993 after the birth of her first child. [facility] and was placed on Dilantin but this caused gum swelling and she was switched to Tegretol. She had side effects on Tegretol including sleepiness and lightheadedness and poor Sz control. Her seizures are simple partial, complex partial and secondarily generalized seizures. Her last generalized event was in 1995.Keppra was added in 2002. Her last MRI brain with and without contrast in March 2003 was normal. Her last EEG was in September 2004 and is not in our old EMR. . She has an underlying medical history of hypertension, hyperlipidemia and seizures. She has had no major surgeries.   No seizure activity in many years currently controlled on Keppra 750 mg twice daily and Carbatrol 300mg  one in the am and two in the pm.  She returns for reevaluation   HPI: Is a long established patient of Dr. Avie EchevariaJames Lawson.  She was last seen in the office by Tracy Lawson, Dr. Frances FurbishAthar on 07-27-12 after Dr. Imagene Lawson's retirement. At the time of her presentation she had a fairly high blood pressure of 181/87 mmHg with a low pulse rate of 50. The patient is on clonidine, but her main neurologic complaint is a long-standing seizure disorder which has been well controlled with medications. She is unaccompanied today as the seizures were described as simple partial at onset with some complex secondary generalization. Her first seizure was in 1993. The seizure occurred right after the birth of her second child, at 398 months gestational age. The pregnancy was complicated by toxemia  and PRES. Initially treated with phenytoin she developed gum swelling and was switched to carbamazepine under which he become very sleepy, ataxic unsteady in her gait. They were seizures while on the medication, but she describes as pronounced by myoclonic twitches and speech arrest. Under her current regimen of Keppra and carbamazepine, she feels she's doing very well, she does however have a history of poorly controlled hypertension in the past. No aura , diplopia and no known hyponatremia.  She has no new complaints today. She denies any recent illness, aura, seizure, problems with her medication or side effects. There is a new entry to her family history , her sister died of a Stroke in decemeber 200[address] , at age 66, her younger sister had a stroke, but is cared for at home.      REVIEW OF SYSTEMS: Full 14 system review of systems performed and notable only for those listed, all others are neg:  Constitutional: neg  Cardiovascular: neg Ear/Nose/Throat: neg  Skin: neg Eyes: neg Respiratory: neg Gastroitestinal: neg  Hematology/Lymphatic: neg  Endocrine: neg Musculoskeletal:neg Allergy/Immunology: neg Neurological: History of seizure disorder Psychiatric: neg Sleep : neg   ALLERGIES: No Known Allergies  HOME MEDICATIONS: Outpatient Medications Prior to Visit  Medication Sig Dispense Refill  . amLODipine (NORVASC) 10 MG tablet Take 10 mg by mouth daily.    Marland Kitchen. aspirin 81 MG tablet Take 81 mg by mouth daily.    Marland Kitchen. atenolol (TENORMIN) 100 MG tablet Take 100 mg by mouth 2 (two) times daily.    Marland Kitchen. atorvastatin (LIPITOR) 20 MG tablet Take  20 mg by mouth daily.    . carbamazepine (CARBATROL) 300 MG 12 hr capsule TAKE 1  CAPSULE in the am and 2 at night.  (Change in her dose). 270 capsule 3  . cloNIDine (CATAPRES) 0.1 MG tablet Take 0.1 mg by mouth 2 (two) times daily.    . furosemide (LASIX) 80 MG tablet Take 40 mg by mouth 2 (two) times daily.     Marland Kitchen levETIRAcetam (KEPPRA) 750 MG  tablet TAKE 1 TABLET BY MOUTH EVERY 12 HOURS 180 tablet 3  . potassium chloride SA (K-DUR,KLOR-CON) 20 MEQ tablet Take 20 mEq by mouth daily.    Marland Kitchen spironolactone (ALDACTONE) 50 MG tablet Take 25 mg by mouth 2 (two) times daily.      No facility-administered medications prior to visit.     PAST MEDICAL HISTORY: Past Medical History:  Diagnosis Date  . Localization-related (focal) (partial) epilepsy and epileptic syndromes with complex partial seizures, without mention of intractable epilepsy   . Pain in limb   . Seizures (HCC)   . Sleep disturbance, unspecified   . Unspecified essential hypertension     PAST SURGICAL HISTORY: No past surgical history on file.  FAMILY HISTORY: Family History  Problem Relation Age of Onset  . Stroke Father   . Heart failure Mother   . Stroke Sister     SOCIAL HISTORY: Social History   Socioeconomic History  . Marital status: Married    Spouse name: Tracy Lawson  . Number of children: 2  . Years of education: 59  . Highest education level: Not on file  Occupational History  . Not on file  Social Needs  . Financial resource strain: Not on file  . Food insecurity:    Worry: Not on file    Inability: Not on file  . Transportation needs:    Medical: Not on file    Non-medical: Not on file  Tobacco Use  . Smoking status: Former Smoker    Types: Cigarettes    Last attempt to quit: 07/27/1992    Years since quitting: 24.8  . Smokeless tobacco: Never Used  Substance and Sexual Activity  . Alcohol use: No  . Drug use: No  . Sexual activity: Not on file  Lifestyle  . Physical activity:    Days per week: Not on file    Minutes per session: Not on file  . Stress: Not on file  Relationships  . Social connections:    Talks on phone: Not on file    Gets together: Not on file    Attends religious service: Not on file    Active member of club or organization: Not on file    Attends meetings of clubs or organizations: Not on file     Relationship status: Not on file  . Intimate partner violence:    Fear of current or ex partner: Not on file    Emotionally abused: Not on file    Physically abused: Not on file    Forced sexual activity: Not on file  Other Topics Concern  . Not on file  Social History Narrative   Patient is married Tracy Lawson) and lives with her husband, foster children and adopted children.   Patient is retired.   Patient has a college education.   Patient is right-handed.   Patient does not drink any caffeine.   Patient has two children.     PHYSICAL EXAM  Vitals:   05/09/17   BP: (!) 136/80   Pulse: Marland Kitchen)  56   Weight: 199 lb (90.3 kg)   Height: 5\' 6"  (1.676 m)    Body mass index is 32.05 kg/m. Generalized: Well developed, Obese female in no acute distress  Head: normocephalic and atraumatic,. Oropharynx benign  Neck: Supple,  Musculoskeletal: No deformity   Neurological examination   Mentation: Alert oriented to time, place, history taking. Attention span and concentration appropriate. Recent and remote memory intact. Follows all commands speech and language fluent.   Cranial nerve II-XII: Pupils were equal round reactive to light extraocular movements were full, visual field were full on confrontational test. Mild ptosis on the left. Facial sensation and strength were normal. hearing was intact to finger rubbing bilaterally. Uvula tongue midline. head turning and shoulder shrug were normal and symmetric.Tongue protrusion into cheek strength was normal. Motor: normal bulk and tone, full strength in the BUE, BLE, fine finger movements normal, no pronator drift. No focal weakness Sensory: normal and symmetric to light touch, in the upper and lower extremities Coordination: finger-nose-finger, heel-to-shin bilaterally, no dysmetria, no tremor Reflexes: Symmetric upper and lower plantar responses were flexor bilaterally. Gait and Station: Rising up from seated position without assistance,  normal stance, moderate stride, good arm swing, smooth turning, able to perform tiptoe, and heel walking without difficulty. Tandem gait is steady.   DIAGNOSTIC DATA (LABS, IMAGING, TESTING) -  ASSESSMENT AND PLAN  67 y.o. year old female  has a past medical history of essential hypertension; Localization-related (focal) (partial) epilepsy and epileptic syndromes with complex partial seizures, without mention of intractable epilepsy; and Seizures (HCC). here to follow-up. No seizures in many years.    PLAN: Continue Carbatrol at current dose will refill when labs back  Will check CBC CMP to monitor adverse effects Carbatrol  Carbamazepine level to check for therapeutic range/toxicity  Continue Keppra at current dose will refill Follow-up yearly Call for seizure activity Nilda Riggs, [facility], [facility], APRN  [facility] [address] [place], [place] [address] [phone]

## 2017-05-09 ENCOUNTER — Encounter: Payer: Self-pay | Admitting: Nurse Practitioner

## 2017-05-09 ENCOUNTER — Ambulatory Visit (INDEPENDENT_AMBULATORY_CARE_PROVIDER_SITE_OTHER): Payer: Medicare Other | Admitting: Nurse Practitioner

## 2017-05-09 VITALS — BP 136/80 | HR 50 | Ht 66.0 in | Wt 198.6 lb

## 2017-05-09 DIAGNOSIS — Z5181 Encounter for therapeutic drug level monitoring: Secondary | ICD-10-CM

## 2017-05-09 DIAGNOSIS — G40309 Generalized idiopathic epilepsy and epileptic syndromes, not intractable, without status epilepticus: Secondary | ICD-10-CM

## 2017-05-09 DIAGNOSIS — G40209 Localization-related (focal) (partial) symptomatic epilepsy and epileptic syndromes with complex partial seizures, not intractable, without status epilepticus: Secondary | ICD-10-CM | POA: Diagnosis not present

## 2017-05-09 MED ORDER — LEVETIRACETAM 750 MG PO TABS: 750.0000 mg | ORAL_TABLET | Freq: Two times a day (BID) | ORAL | 3 refills | Status: DC

## 2017-05-09 NOTE — Patient Instructions (Signed)
Continue Carbatrol at current dose will refill  Will check CBC CMP to monitor adverse effects Carbatrol  Carbamazepine level to check for therapeutic range/toxicity  Continue Keppra at current dose will refill Follow-up yearly Call for seizure activity

## 2017-05-10 ENCOUNTER — Telehealth: Payer: Self-pay | Admitting: *Deleted

## 2017-05-10 ENCOUNTER — Other Ambulatory Visit: Payer: Self-pay | Admitting: Nurse Practitioner

## 2017-05-10 DIAGNOSIS — G40209 Localization-related (focal) (partial) symptomatic epilepsy and epileptic syndromes with complex partial seizures, not intractable, without status epilepticus: Secondary | ICD-10-CM

## 2017-05-10 LAB — COMPREHENSIVE METABOLIC PANEL
A/G RATIO: 1.2 (ref 1.2–2.2)
ALBUMIN: 3.7 g/dL (ref 3.6–4.8)
ALK PHOS: 113 IU/L (ref 39–117)
ALT: 14 IU/L (ref 0–32)
AST: 20 IU/L (ref 0–40)
BILIRUBIN TOTAL: 0.2 mg/dL (ref 0.0–1.2)
BUN / CREAT RATIO: 18 (ref 12–28)
BUN: 16 mg/dL (ref 8–27)
CHLORIDE: 106 mmol/L (ref 96–106)
CO2: 28 mmol/L (ref 20–29)
CREATININE: 0.89 mg/dL (ref 0.57–1.00)
Calcium: 8.9 mg/dL (ref 8.7–10.3)
GFR calc Af Amer: 79 mL/min/{1.73_m2} (ref >59)
GFR calc non Af Amer: 68 mL/min/{1.73_m2} (ref >59)
GLOBULIN, TOTAL: 3.1 g/dL (ref 1.5–4.5)
Glucose: 114 mg/dL — ABNORMAL HIGH (ref 65–99)
POTASSIUM: 4 mmol/L (ref 3.5–5.2)
SODIUM: 145 mmol/L — AB (ref 134–144)
Total Protein: 6.8 g/dL (ref 6.0–8.5)

## 2017-05-10 LAB — CBC WITH DIFFERENTIAL/PLATELET
BASOS ABS: 0 10*3/uL (ref 0.0–0.2)
Basos: 0 %
EOS (ABSOLUTE): 0 10*3/uL (ref 0.0–0.4)
Eos: 0 %
HEMOGLOBIN: 12.5 g/dL (ref 11.1–15.9)
Hematocrit: 37.7 % (ref 34.0–46.6)
Immature Grans (Abs): 0 10*3/uL (ref 0.0–0.1)
Immature Granulocytes: 0 %
LYMPHS ABS: 1.2 10*3/uL (ref 0.7–3.1)
Lymphs: 37 %
MCH: 30.4 pg (ref 26.6–33.0)
MCHC: 33.2 g/dL (ref 31.5–35.7)
MCV: 92 fL (ref 79–97)
MONOCYTES: 9 %
MONOS ABS: 0.3 10*3/uL (ref 0.1–0.9)
NEUTROS ABS: 1.8 10*3/uL (ref 1.4–7.0)
Neutrophils: 54 %
Platelets: 206 10*3/uL (ref 150–379)
RBC: 4.11 x10E6/uL (ref 3.77–5.28)
RDW: 14.5 % (ref 12.3–15.4)
WBC: 3.2 10*3/uL — ABNORMAL LOW (ref 3.4–10.8)

## 2017-05-10 LAB — CARBAMAZEPINE LEVEL, TOTAL: CARBAMAZEPINE LVL: 11 ug/mL (ref 4.0–12.0)

## 2017-05-10 MED ORDER — CARBAMAZEPINE ER 300 MG PO CP12: ORAL_CAPSULE | ORAL | 3 refills | Status: DC

## 2017-05-10 NOTE — Telephone Encounter (Signed)
-----   Message from Nilda Riggs, NP sent at 05/10/2017 12:51 PM EDT ----- Labs stable please call the patient

## 2017-05-10 NOTE — Telephone Encounter (Signed)
LMVM for pt home # that labs stable per CM/NP.  Pt to return call if questions.

## 2017-05-15 NOTE — Progress Notes (Signed)
I agree with the assessment and plan as directed by NP .The patient is known to me .   Arling Cerone, MD  

## 2017-11-01 ENCOUNTER — Ambulatory Visit: Payer: Self-pay | Admitting: Cardiology

## 2017-11-07 ENCOUNTER — Ambulatory Visit: Payer: Self-pay | Admitting: Cardiovascular Disease

## 2017-11-10 ENCOUNTER — Ambulatory Visit: Payer: Self-pay | Admitting: Cardiovascular Disease

## 2017-11-18 ENCOUNTER — Ambulatory Visit: Payer: Self-pay | Admitting: Cardiovascular Disease

## 2018-05-02 ENCOUNTER — Telehealth: Payer: Self-pay | Admitting: Neurology

## 2018-05-02 NOTE — Telephone Encounter (Signed)
Due to current COVID 19 pandemic, our office is severely reducing in office visits until further notice, in order to minimize the risk to our patients and healthcare providers.   Called patient and received consent to convert 4/30 appointment to a virtual visit. Patient verbalized understanding of the process and had a family member help her download the app. Patient understands that she will receive an e-mail with directions as well as the link she will need to use in order to join the meeting. Patient understands that she will be called by RN to update chart history sometime prior to appointment, and a call from front office staff to complete the check-in process around 30 minutes prior to appointment.   Pt understands that although there may be some limitations with this type of visit, we will take all precautions to reduce any security or privacy concerns.  Pt understands that this will be treated like an in office visit and we will file with pt's insurance, and there may be a patient responsible charge related to this service.  Pt's email is [email or web address]. Pt understands that the [employer] webex software must be downloaded and operational on the device pt plans to use for the visit.

## 2018-05-08 NOTE — Telephone Encounter (Signed)
Called the patient to over her chart. Advised the pt that was  Calling to review her chart. Pt is busy at the moment and asked for me to call back in the am. Advised the patient for her to call me when she is ready to review her chart. Pt verbalized understanding.

## 2018-05-09 ENCOUNTER — Encounter: Payer: Self-pay | Admitting: Neurology

## 2018-05-09 NOTE — Telephone Encounter (Signed)
Called the patient to review their chart and made sure that everything was up to date. Patient informed they do have the app downloaded as well as received the e-mail for the visit. Instructed to make sure they hold on to the e-mail for the upcoming appointment as it is necessary to access their appointment. Instructed the patient that apx 30 min prior to the appointment the front staff will contact them to make sure they are ready to go for their appointment in case there is any need for troubleshooting it can be completed prior to the appointment time. Pt verbalized understanding. Patient also instructed to completed the sleep scale and reply with answers to that and neck circumference measurements prior to the appointment time.    

## 2018-05-09 NOTE — Addendum Note (Signed)
Addended by: Judi Cong on: 05/09/2018 01:38 PM   Modules accepted: Orders

## 2018-05-11 ENCOUNTER — Ambulatory Visit (INDEPENDENT_AMBULATORY_CARE_PROVIDER_SITE_OTHER): Payer: Medicare Other | Admitting: Neurology

## 2018-05-11 ENCOUNTER — Encounter: Payer: Self-pay | Admitting: Neurology

## 2018-05-11 ENCOUNTER — Ambulatory Visit: Payer: Medicare Other | Admitting: Nurse Practitioner

## 2018-05-11 ENCOUNTER — Other Ambulatory Visit: Payer: Self-pay

## 2018-05-11 DIAGNOSIS — Z5181 Encounter for therapeutic drug level monitoring: Secondary | ICD-10-CM

## 2018-05-11 DIAGNOSIS — G40209 Localization-related (focal) (partial) symptomatic epilepsy and epileptic syndromes with complex partial seizures, not intractable, without status epilepticus: Secondary | ICD-10-CM | POA: Diagnosis not present

## 2018-05-11 MED ORDER — LEVETIRACETAM 750 MG PO TABS: 750.0000 mg | ORAL_TABLET | Freq: Two times a day (BID) | ORAL | 3 refills | Status: DC

## 2018-05-11 MED ORDER — CARBAMAZEPINE ER 300 MG PO CP12: ORAL_CAPSULE | ORAL | 3 refills | Status: DC

## 2018-05-11 NOTE — Progress Notes (Signed)
[facility]  PATIENT: Tracy Lawson DOB: [birth year]   REASON FOR VISIT: Follow-up for seizure disorder, generalized  HISTORY FROM: Patient   Virtual Visit via Video Note  I connected with Gregery Na on 05/11/18 at 10:30 AM EDT by a video enabled telemedicine application and verified that I am speaking with the correct person using two identifiers.  Location: Patient: home Provider: [facility]   I discussed the limitations of evaluation and management by telemedicine and the availability of in person appointments. The patient expressed understanding and agreed to proceed.  History of Present Illness: no seizure like activity in over 12 month- last seizure years ago. At the time under therapy with Dr Sandria Manly, over 8 years ago.  Driving , working, normal activities in daily living.          HISTORY OF PRESENT ILLNESS: UPDATE 05/09/2017 CM Ms. Tracy Lawson is a very pleasant 68 year old right-handed woman who presents for yearly followup  of her seizure disorder. She developed seizures in 1993 after the birth of her first child. She was first seen at [facility], [facility] and was placed on Dilantin but this caused gum swelling and she was switched to Tegretol. She had side effects on Tegretol including sleepiness and lightheadedness and poor Sz control. Her seizures are simple partial, complex partial and secondarily generalized seizures. Her last generalized event was in 1995.Keppra was added in 2002. Her last MRI brain with and without contrast in March 2003 was normal. Her last EEG was in September 2004 and is not in our old EMR. . She has an underlying medical history of hypertension, hyperlipidemia and seizures. She has had no major surgeries.   No seizure activity in many years currently controlled on Keppra 750 mg twice daily and Carbatrol 300mg  one in the am and two in the pm.  She returns for reevaluation   HPI: Is a long established patient of Dr.  Avie Echevaria.  She was last seen in the office by my colleague, [name] on 07-27-12 after Dr. Imagene Gurney retirement. At the time of her presentation she had a fairly high blood pressure of 181/87 mmHg with a low pulse rate of 50. The patient is on clonidine, but her main neurologic complaint is a long-standing seizure disorder which has been well controlled with medications. She is unaccompanied today as the seizures were described as simple partial at onset with some complex secondary generalization. Her first seizure was in 1993. The seizure occurred right after the birth of her second child, at 20 months gestational age. The pregnancy was complicated by toxemia and PRES. Initially treated with phenytoin she developed gum swelling and was switched to carbamazepine under which he become very sleepy, ataxic unsteady in her gait. They were seizures while on the medication, but she describes as pronounced by myoclonic twitches and speech arrest. Under her current regimen of Keppra and carbamazepine, she feels she's doing very well, she does however have a history of poorly controlled hypertension in the past. No aura , diplopia and no known hyponatremia.  She has no new complaints today. She denies any recent illness, aura, seizure, problems with her medication or side effects. There is a new entry to her family history , her sister died of a Stroke in decemeber [address] , at age 32, her younger sister had a stroke, but is cared for at home.      REVIEW OF SYSTEMS:  None endorsed.    ALLERGIES: No Known Allergies  HOME  MEDICATIONS: Outpatient Medications Prior to Visit  Medication Sig Dispense Refill  . amLODipine (NORVASC) 10 MG tablet Take 10 mg by mouth daily.    Marland Kitchen. aspirin 81 MG tablet Take 81 mg by mouth daily.    Marland Kitchen. atenolol (TENORMIN) 100 MG tablet Take 100 mg by mouth 2 (two) times daily.    Marland Kitchen. atorvastatin (LIPITOR) 20 MG tablet Take 20 mg by mouth daily.    . carbamazepine (CARBATROL) 300  MG 12 hr capsule TAKE 1  CAPSULE in the am and 2 at night.  (Change in her dose). 270 capsule 3  . cloNIDine (CATAPRES) 0.2 MG tablet Take 0.1 mg by mouth 2 (two) times daily.     . furosemide (LASIX) 80 MG tablet Take 40 mg by mouth 2 (two) times daily.     Marland Kitchen. levETIRAcetam (KEPPRA) 750 MG tablet Take 1 tablet (750 mg total) by mouth every 12 (twelve) hours. 180 tablet 3  . potassium chloride SA (K-DUR,KLOR-CON) 20 MEQ tablet Take 20 mEq by mouth daily.     No facility-administered medications prior to visit.     PAST MEDICAL HISTORY: Past Medical History:  Diagnosis Date  . Localization-related (focal) (partial) epilepsy and epileptic syndromes with complex partial seizures, without mention of intractable epilepsy   . Pain in limb   . Seizures (HCC)   . Sleep disturbance, unspecified   . Unspecified essential hypertension     PAST SURGICAL HISTORY: No past surgical history on file.  FAMILY HISTORY: Family History  Problem Relation Age of Onset  . Stroke Father   . Heart failure Mother   . Stroke Sister     SOCIAL HISTORY: Social History   Socioeconomic History  . Marital status: Married    Spouse name: Chrissie NoaWilliam  . Number of children: 2  . Years of education: 8314  . Highest education level: Not on file  Occupational History  . Not on file  Social Needs  . Financial resource strain: Not on file  . Food insecurity:    Worry: Not on file    Inability: Not on file  . Transportation needs:    Medical: Not on file    Non-medical: Not on file  Tobacco Use  . Smoking status: Former Smoker    Types: Cigarettes    Last attempt to quit: 07/27/1992    Years since quitting: 25.8  . Smokeless tobacco: Never Used  Substance and Sexual Activity  . Alcohol use: No  . Drug use: No  . Sexual activity: Not on file  Lifestyle  . Physical activity:    Days per week: Not on file    Minutes per session: Not on file  . Stress: Not on file  Relationships  . Social connections:     Talks on phone: Not on file    Gets together: Not on file    Attends religious service: Not on file    Active member of club or organization: Not on file    Attends meetings of clubs or organizations: Not on file    Relationship status: Not on file  . Intimate partner violence:    Fear of current or ex partner: Not on file    Emotionally abused: Not on file    Physically abused: Not on file    Forced sexual activity: Not on file  Other Topics Concern  . Not on file  Social History Narrative   Patient is married Chrissie Noa(William) and lives with her husband, foster children and adopted  children.   Patient is retired.   Patient has a college education.   Patient is right-handed.   Patient does not drink any caffeine.   Patient has two children.     Observations/Objective:   Vitals:   05/09/17   BP: (!) 136/80   Pulse: (!) 56   Weight: 199 lb (90.3 kg)   Height:  (1.676 m)     No vitals in virtual visit.   There is no height or weight on file to calculate BMI. Generalized: Well developed, Obese female in no acute distress  Head: normocephalic and atraumatic,. Oropharynx benign  Neck: Supple,  Musculoskeletal: No deformity   Mentation: Alert oriented to time, place, history taking. Attention span and concentration appropriate. Recent and remote memory intact. Follows all commands speech and language fluent.   Cranial nerve : no loss of smell and taste.  Pupils were equal round reactive to light extraocular movements were full, visual field were full on confrontational test. Mild ptosis on the left. Facial  strength were normal. Uvula and tongue midline, no tongue bites. Full ROM of . head turning and shoulder shrug -normal and symmetric.Tongue protrusion into cheek strength was normal. Motor: normal bulk and tone, full  ROM in the BUE, BLE, fine finger movements normal, no pronator drift. No focal weakness. No changes in handwriting.   Gait and Station: Rising up from seated  position without assistance, normal stance, moderate stride, turned with 3 steps.  Marland Kitchen   DIAGNOSTIC DATA (LABS, IMAGING, TESTING);  Assessment and Plan:CMET and Tegretol level, CBC. -No Keppra levels needed.  I will  order as "future"- If possible to be done in [place] at Dr. Fara Boros office.   68 y.o. year old female  has a past medical history of essential hypertension; Localization-related (focal) (partial) epilepsy and epileptic syndromes with complex partial seizures, who had no seizures in many years.     Follow Up Instructions: next visit with NP for refills in 12 month.     I discussed the assessment and treatment plan with the patient. The patient was provided an opportunity to ask questions and all were answered. The patient agreed with the plan and demonstrated an understanding of the instructions.   The patient was advised to call back or seek an in-person evaluation if the symptoms worsen or if the condition fails to improve as anticipated.  I provided 15 minutes of non-face-to-face time during this encounter.   Melvyn Novas, MD Follow-up yearly Call for seizure activity  Melvyn Novas, MD  [facility] [address] [place], [place] [address] [phone]

## 2018-05-11 NOTE — Patient Instructions (Signed)
Carbamazepine  What is this medicine? CARBAMAZEPINE (kar ba MAZ e peen) is used to control seizures caused by certain types of epilepsy. This medicine is also used to treat nerve related pain. It is not for common aches and pains. This medicine may be used for other purposes; ask your health care provider or pharmacist if you have questions. COMMON BRAND NAME(S): Carbatrol What should I tell my health care provider before I take this medicine? They need to know if you have any of these conditions: -Asian ancestry -bone marrow disease -glaucoma -heart disease or irregular heartbeat -kidney disease -liver disease -low blood counts, like low white cell, platelet, or red cell counts -porphyria -psychotic disorders -suicidal thoughts, plans, or attempt; a previous suicide attempt by you or a family member -an unusual or allergic reaction to carbamazepine, tricyclic antidepressants, phenytoin, phenobarbital or other medicines, foods, dyes, or preservatives -pregnant or trying to get pregnant -breast-feeding How should I use this medicine? Take this medicine by mouth with a glass of water. Follow the directions on the prescription label. Do not cut, crush or chew this medicine. Take this medicine with or without food. The capsules can be opened and the beads sprinkled over food such as applesauce or other similar food product. Take your doses at regular intervals. Do not take your medicine more often than directed. Do not stop taking except on your doctor's advice. A special MedGuide will be given to you by the pharmacist with each prescription and refill. Be sure to read this information carefully each time. Talk to your pediatrician regarding the use of this medicine in children. While this drug may be prescribed for selected conditions, precautions do apply. Overdosage: If you think you have taken too much of this medicine contact a poison control center or emergency room at once. NOTE: This  medicine is only for you. Do not share this medicine with others. What if I miss a dose? If you miss a dose, take it as soon as you can. If it is almost time for your next dose, take only that dose. Do not take double or extra doses. What may interact with this medicine? Do not take this medicine with any of the following medications: -certain medicines used to treat HIV infection or AIDS that are given in combination with cobicistat - delavirdine - MAOIs like Carbex, Eldepryl, Marplan, Nardil, and Parnate - nefazodone - oxcarbazepine This medicine may also interact with the following medications: - acetaminophen - acetazolamide - barbiturate medicines for inducing sleep or treating seizures, like phenobarbital - certain antibiotics like clarithromycin, erythromycin or troleandomycin - cimetidine - cyclosporine - danazol - dicumarol - doxycycline - female hormones, including estrogens and birth control pills - grapefruit juice - isoniazid, INH - levothyroxine and other thyroid hormones - lithium and other medicines to treat mood problems or psychotic disturbances - loratadine - medicines for angina or high blood pressure - medicines for cancer - medicines for depression or anxiety - medicines for sleep - medicines to treat fungal infections, like fluconazole, itraconazole or ketoconazole - medicines used to treat HIV infection or AIDS - methadone - niacinamide - praziquantel - propoxyphene - rifampin or rifabutin - seizure or epilepsy medicine - steroid medicines such as prednisone or cortisone - theophylline - tramadol - warfarin This list may not describe all possible interactions. Give your health care provider a list of all the medicines, herbs, non-prescription drugs, or dietary supplements you use. Also tell them if you smoke, drink alcohol, or use illegal drugs.  Some items may interact with your medicine. What should I watch for  while using this medicine? Visit your doctor or health care professional for a regular check on your progress. Do not change brands or dosage forms of this medicine without discussing the change with your doctor or health care professional. If you are taking this medicine for epilepsy (seizures), do not stop taking it suddenly. This increases the risk of seizures. Wear a Arboriculturist or necklace. Carry an identification card with information about your condition, medications, and doctor or health care professional. Bonita Quin may get drowsy, dizzy, or have blurred vision. Do not drive, use machinery, or do anything that needs mental alertness until you know how this medicine affects you. To reduce dizzy or fainting spells, do not sit or stand up quickly, especially if you are an older patient. Alcohol can increase drowsiness and dizziness. Avoid alcoholic drinks. Birth control pills may not work properly while you are taking this medicine. Talk to your doctor about using an extra method of birth control. This medicine can make you more sensitive to the sun. Keep out of the sun. If you cannot avoid being in the sun, wear protective clothing and use sunscreen. Do not use sun lamps or tanning beds/booths. The use of this medicine may increase the chance of suicidal thoughts or actions. Pay special attention to how you are responding while on this medicine. Any worsening of mood, or thoughts of suicide or dying should be reported to your health care professional right away. Women who become pregnant while using this medicine may enroll in the [place] Antiepileptic Drug Pregnancy Registry by calling [phone]. This registry collects information about the safety of antiepileptic drug use during pregnancy. This medicine may cause a decrease in vitamin D and folic acid. You should make sure that you get enough vitamins while you are taking this medicine. Discuss the foods you eat and the vitamins you  take with your health care professional. What side effects may I notice from receiving this medicine? Side effects that you should report to your doctor or health care professional as soon as possible: -allergic reactions like skin rash, itching or hives, swelling of the face, lips, or tongue -breathing problems -changes in vision -confusion -dark urine -fast or irregular heartbeat -fever or chills, sore throat -mouth ulcers -pain or difficulty passing urine -redness, blistering, peeling or loosening of the skin, including inside the mouth -ringing in the ears -seizures -stomach pain -swollen joints or muscle/joint aches and pains -unusual bleeding or bruising -unusually weak or tired -vomiting -worsening of mood, thoughts or actions of suicide or dying -yellowing of the eyes or skin Side effects that usually do not require medical attention (report to your doctor or health care professional if they continue or are bothersome): -clumsiness or unsteadiness -diarrhea or constipation -headache -increased sweating -nausea This list may not describe all possible side effects. Call your doctor for medical advice about side effects. You may report side effects to FDA at 1-800-FDA-1088. Where should I keep my medicine? Keep out of reach of children. Store at room temperature between 15 and 30 degrees C (59 and 86 degrees F). Keep container tightly closed. Protect from moisture. Throw away any unused medicine after the expiration date. NOTE: This sheet is a summary. It may not cover all possible information. If you have questions about this medicine, talk to your doctor, pharmacist, or health care provider.  2019 Elsevier/Gold Standard (2016-09-06 12:06:31)

## 2018-06-01 ENCOUNTER — Telehealth: Payer: Self-pay | Admitting: Neurology

## 2018-06-01 NOTE — Telephone Encounter (Signed)
Called the patient to advise her Dr Vickey Huger would like for her to follow up in a year with NP. LVM for the pt to call back so we could scheduled 05/11/2019 or after with NP

## 2019-03-29 ENCOUNTER — Other Ambulatory Visit: Payer: Self-pay | Admitting: Neurology

## 2019-03-29 DIAGNOSIS — G40209 Localization-related (focal) (partial) symptomatic epilepsy and epileptic syndromes with complex partial seizures, not intractable, without status epilepticus: Secondary | ICD-10-CM

## 2019-05-14 ENCOUNTER — Encounter: Payer: Self-pay | Admitting: Adult Health

## 2019-05-14 ENCOUNTER — Ambulatory Visit: Payer: Medicare Other | Admitting: Adult Health

## 2019-05-14 ENCOUNTER — Ambulatory Visit (INDEPENDENT_AMBULATORY_CARE_PROVIDER_SITE_OTHER): Payer: Medicare PPO | Admitting: Adult Health

## 2019-05-14 ENCOUNTER — Other Ambulatory Visit: Payer: Self-pay

## 2019-05-14 VITALS — BP 193/88 | HR 54 | Temp 97.0°F | Ht 65.5 in | Wt 198.0 lb

## 2019-05-14 DIAGNOSIS — G40209 Localization-related (focal) (partial) symptomatic epilepsy and epileptic syndromes with complex partial seizures, not intractable, without status epilepticus: Secondary | ICD-10-CM

## 2019-05-14 DIAGNOSIS — Z5181 Encounter for therapeutic drug level monitoring: Secondary | ICD-10-CM

## 2019-05-14 MED ORDER — CARBAMAZEPINE ER 300 MG PO CP12: ORAL_CAPSULE | ORAL | 3 refills | Status: DC

## 2019-05-14 MED ORDER — LEVETIRACETAM 750 MG PO TABS: 750.0000 mg | ORAL_TABLET | Freq: Two times a day (BID) | ORAL | 3 refills | Status: DC

## 2019-05-14 NOTE — Patient Instructions (Signed)
Continue Keppra and Carbamazepine Blood work today If you have any seizure events please let us know.   

## 2019-05-14 NOTE — Progress Notes (Signed)
PATIENT: Tracy Lawson DOB: [birth year]  REASON FOR VISIT: follow up HISTORY FROM: patient  HISTORY OF PRESENT ILLNESS: Today 05/14/19:  Tracy Lawson is a 69 year old female with a history of seizures.  She returns today for follow-up.  She remains on Keppra and carbamazepine.  She denies any seizure events.  Denies any changes with her gait or balance.  She operates a Librarian, academic.  She is able to complete all ADLs independently.  She lives at home with her spouse and 2 children.  She returns today for an evaluation.  HISTORY HPI: Is a long established patient of [name].  She was last seen in the office by my colleague, [name] on 07-27-12 after Dr. Imagene Gurney retirement. At the time of her presentation she had a fairly high blood pressure of 181/87 mmHg with a low pulse rate of 50. The patient is on clonidine, but her main neurologic complaint is a long-standing seizure disorder which has been well controlled with medications. She is unaccompanied today as the seizures were described as simple partial at onset with some complex secondary generalization. Her first seizure was in 1993. The seizure occurred right after the birth of her second child, at 78 months gestational age. The pregnancy was complicated by toxemia and PRES. Initially treated with phenytoin she developed gum swelling and was switched to carbamazepine under which he become very sleepy, ataxic unsteady in her gait. They were seizures while on the medication, but she describes as pronounced by myoclonic twitches and speech arrest. Under her current regimen of Keppra and carbamazepine, she feels she's doing very well, she does however have a history of poorly controlled hypertension in the past. No aura , diplopia and no known hyponatremia.  She has no new complaints today. She denies any recent illness, aura, seizure, problems with her medication or side effects. There is a new entry to her family history , her sister  died of a Stroke in decemeber [address] , at age 56, her younger sister had a stroke, but is cared for at home.   REVIEW OF SYSTEMS: Out of a complete 14 system review of symptoms, the patient complains only of the following symptoms, and all other reviewed systems are negative.  See HPI  ALLERGIES: No Known Allergies  HOME MEDICATIONS: Outpatient Medications Prior to Visit  Medication Sig Dispense Refill  . amLODipine (NORVASC) 10 MG tablet Take 10 mg by mouth daily.    Marland Kitchen aspirin 81 MG tablet Take 81 mg by mouth daily.    Marland Kitchen atenolol (TENORMIN) 100 MG tablet Take 100 mg by mouth 2 (two) times daily.    Marland Kitchen atorvastatin (LIPITOR) 20 MG tablet Take 20 mg by mouth daily.    . carbamazepine (CARBATROL) 300 MG 12 hr capsule TAKE ONE CAPSULE BY MOUTH IN IN THE MORNING AND TWO AT BEDTIME 270 capsule 0  . cloNIDine (CATAPRES) 0.2 MG tablet Take 0.1 mg by mouth 2 (two) times daily.     . furosemide (LASIX) 80 MG tablet Take 40 mg by mouth 2 (two) times daily.     Marland Kitchen levETIRAcetam (KEPPRA) 750 MG tablet TAKE 1 TABLET BY MOUTH EVERY 12 HOURS 180 tablet 0  . potassium chloride SA (K-DUR,KLOR-CON) 20 MEQ tablet Take 20 mEq by mouth daily.     No facility-administered medications prior to visit.    PAST MEDICAL HISTORY: Past Medical History:  Diagnosis Date  . 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 Pain in limb   . Seizures (HCC)   . Sleep disturbance, unspecified   . Unspecified essential hypertension     PAST SURGICAL HISTORY: No past surgical history on file.  FAMILY HISTORY: Family History  Problem Relation Age of Onset  . Stroke Father   . Heart failure Mother   . Stroke Sister     SOCIAL HISTORY: Social History   Socioeconomic History  . Marital status: Married    Spouse name: Chrissie Noa  . Number of children: 2  . Years of education: 20  . Highest education level: Not on  file  Occupational History  . Not on file  Tobacco Use  . Smoking status: Former Smoker    Types: Cigarettes    Quit date: 07/27/1992    Years since quitting: 26.8  . Smokeless tobacco: Never Used  Substance and Sexual Activity  . Alcohol use: No  . Drug use: No  . Sexual activity: Not on file  Other Topics Concern  . Not on file  Social History Narrative   Patient is married Chrissie Noa) and lives with her husband, foster children and adopted children.   Patient is retired.   Patient has a college education.   Patient is right-handed.   Patient does not drink any caffeine.   Patient has two children.   Social Determinants of Health   Financial Resource Strain:   . Difficulty of Paying Living Expenses:   Food Insecurity:   . Worried About Programme researcher, broadcasting/film/video in the Last Year:   . Barista in the Last Year:   Transportation Needs:   . Freight forwarder (Medical):   Marland Kitchen Lack of Transportation (Non-Medical):   Physical Activity:   . Days of Exercise per Week:   . Minutes of Exercise per Session:   Stress:   . Feeling of Stress :   Social Connections:   . Frequency of Communication with Friends and Family:   . Frequency of Social Gatherings with Friends and Family:   . Attends Religious Services:   . Active Member of Clubs or Organizations:   . Attends Banker Meetings:   Marland Kitchen Marital Status:   Intimate Partner Violence:   . Fear of Current or Ex-Partner:   . Emotionally Abused:   Marland Kitchen Physically Abused:   . Sexually Abused:       PHYSICAL EXAM  Vitals:   05/14/19 0916  BP: (!) 193/88  Pulse: (!) 54  Temp: (!) 97 F (36.1 C)  Weight: 198 lb (89.8 kg)  Height: 5' 5.5" (1.664 m)   Body mass index is 32.45 kg/m.  Generalized: Well developed, in no acute distress   Neurological examination  Mentation: Alert oriented to time, place, history taking. Follows all commands speech and language fluent Cranial nerve II-XII: Pupils were equal round  reactive to light. Extraocular movements were full, visual field were full on confrontational test. Head turning and shoulder shrug  were normal and symmetric. Motor: The motor testing reveals 5 over 5 strength of all 4 extremities. Good symmetric motor tone is noted throughout.  Sensory: Sensory testing is intact to soft touch on all 4 extremities. No evidence of extinction is noted.  Coordination: Cerebellar testing reveals good finger-nose-finger and heel-to-shin bilaterally.  Gait and station: Gait is normal.  Reflexes: Deep tendon reflexes are symmetric and normal bilaterally.   DIAGNOSTIC DATA (LABS, IMAGING, TESTING) - I reviewed patient records, labs, notes, testing and  imaging myself where available.  Lab Results  Component Value Date   WBC 3.2 (L) 05/09/2017   HGB 12.5 05/09/2017   HCT 37.7 05/09/2017   MCV 92 05/09/2017   PLT 206 05/09/2017      Component Value Date/Time   NA 145 (H) 05/09/2017 1132   K 4.0 05/09/2017 1132   CL 106 05/09/2017 1132   CO2 28 05/09/2017 1132   GLUCOSE 114 (H) 05/09/2017 1132   BUN 16 05/09/2017 1132   CREATININE 0.89 05/09/2017 1132   CALCIUM 8.9 05/09/2017 1132   PROT 6.8 05/09/2017 1132   ALBUMIN 3.7 05/09/2017 1132   AST 20 05/09/2017 1132   ALT 14 05/09/2017 1132   ALKPHOS 113 05/09/2017 1132   BILITOT 0.2 05/09/2017 1132   GFRNONAA 68 05/09/2017 1132   GFRAA 79 05/09/2017 1132      ASSESSMENT AND PLAN 69 y.o. year old female  has a past medical history of Localization-related (focal) (partial) epilepsy and epileptic syndromes with complex partial seizures, without mention of intractable epilepsy, Pain in limb, Seizures ([place]), Sleep disturbance, unspecified, and Unspecified essential hypertension. here with:  1.  Seizures  -Continue carbamazepine and Keppra -Blood work today -Advised if she has any seizure event she should let us know -Follow-up in 1 year or sooner if needed  I spent 20 minutes of face-to-face and  non-face-to-face time with patient.  This included previsit chart review, lab review, study review, order entry, electronic health record documentation, patient education.  Ward Givens, MSN, NP-C 05/14/2019, 9:23 AM [facility] [address], [place], [place] [address] [phone]

## 2019-05-15 ENCOUNTER — Telehealth: Payer: Self-pay

## 2019-05-15 LAB — CBC WITH DIFFERENTIAL/PLATELET
Basophils Absolute: 0 10*3/uL (ref 0.0–0.2)
Basos: 0 %
EOS (ABSOLUTE): 0 10*3/uL (ref 0.0–0.4)
Eos: 0 %
Hematocrit: 38.4 % (ref 34.0–46.6)
Hemoglobin: 12.7 g/dL (ref 11.1–15.9)
Immature Grans (Abs): 0 10*3/uL (ref 0.0–0.1)
Immature Granulocytes: 0 %
Lymphocytes Absolute: 1.2 10*3/uL (ref 0.7–3.1)
Lymphs: 33 %
MCH: 30.5 pg (ref 26.6–33.0)
MCHC: 33.1 g/dL (ref 31.5–35.7)
MCV: 92 fL (ref 79–97)
Monocytes Absolute: 0.4 10*3/uL (ref 0.1–0.9)
Monocytes: 10 %
Neutrophils Absolute: 2 10*3/uL (ref 1.4–7.0)
Neutrophils: 57 %
Platelets: 194 10*3/uL (ref 150–450)
RBC: 4.17 x10E6/uL (ref 3.77–5.28)
RDW: 13.3 % (ref 11.7–15.4)
WBC: 3.6 10*3/uL (ref 3.4–10.8)

## 2019-05-15 LAB — COMPREHENSIVE METABOLIC PANEL
ALT: 14 IU/L (ref 0–32)
AST: 21 IU/L (ref 0–40)
Albumin/Globulin Ratio: 1.3 (ref 1.2–2.2)
Albumin: 3.8 g/dL (ref 3.8–4.8)
Alkaline Phosphatase: 111 IU/L (ref 39–117)
BUN/Creatinine Ratio: 16 (ref 12–28)
BUN: 13 mg/dL (ref 8–27)
Bilirubin Total: <0.2 mg/dL (ref 0.0–1.2)
CO2: 26 mmol/L (ref 20–29)
Calcium: 8.9 mg/dL (ref 8.7–10.3)
Chloride: 107 mmol/L — ABNORMAL HIGH (ref 96–106)
Creatinine, Ser: 0.8 mg/dL (ref 0.57–1.00)
GFR calc Af Amer: 88 mL/min/{1.73_m2} (ref >59)
GFR calc non Af Amer: 77 mL/min/{1.73_m2} (ref >59)
Globulin, Total: 3 g/dL (ref 1.5–4.5)
Glucose: 113 mg/dL — ABNORMAL HIGH (ref 65–99)
Potassium: 4.1 mmol/L (ref 3.5–5.2)
Sodium: 146 mmol/L — ABNORMAL HIGH (ref 134–144)
Total Protein: 6.8 g/dL (ref 6.0–8.5)

## 2019-05-15 LAB — CARBAMAZEPINE LEVEL, TOTAL: Carbamazepine (Tegretol), S: 9.7 ug/mL (ref 4.0–12.0)

## 2019-05-15 NOTE — Telephone Encounter (Signed)
-----   Message from Butch Penny, NP sent at 05/15/2019 10:22 AM EDT ----- Labs results are unremarkable. Please call patient with results.

## 2019-05-15 NOTE — Telephone Encounter (Signed)
I called pt to discuss. No answer, left a message asking her to call us back. 

## 2019-05-16 NOTE — Telephone Encounter (Signed)
I called pt and relayed that her lab results per MM/NP were unremarkable.  She verbalized understanding.

## 2019-08-31 DIAGNOSIS — Z7189 Other specified counseling: Secondary | ICD-10-CM | POA: Diagnosis not present

## 2019-08-31 DIAGNOSIS — E559 Vitamin D deficiency, unspecified: Secondary | ICD-10-CM | POA: Diagnosis not present

## 2019-08-31 DIAGNOSIS — Z1339 Encounter for screening examination for other mental health and behavioral disorders: Secondary | ICD-10-CM | POA: Diagnosis not present

## 2019-08-31 DIAGNOSIS — Z299 Encounter for prophylactic measures, unspecified: Secondary | ICD-10-CM | POA: Diagnosis not present

## 2019-08-31 DIAGNOSIS — Z1211 Encounter for screening for malignant neoplasm of colon: Secondary | ICD-10-CM | POA: Diagnosis not present

## 2019-08-31 DIAGNOSIS — Z0000 Encounter for general adult medical examination without abnormal findings: Secondary | ICD-10-CM | POA: Diagnosis not present

## 2019-08-31 DIAGNOSIS — Z79899 Other long term (current) drug therapy: Secondary | ICD-10-CM | POA: Diagnosis not present

## 2019-08-31 DIAGNOSIS — E669 Obesity, unspecified: Secondary | ICD-10-CM | POA: Diagnosis not present

## 2019-08-31 DIAGNOSIS — Z1331 Encounter for screening for depression: Secondary | ICD-10-CM | POA: Diagnosis not present

## 2019-08-31 DIAGNOSIS — I10 Essential (primary) hypertension: Secondary | ICD-10-CM | POA: Diagnosis not present

## 2019-08-31 DIAGNOSIS — E7800 Pure hypercholesterolemia, unspecified: Secondary | ICD-10-CM | POA: Diagnosis not present

## 2019-08-31 DIAGNOSIS — Z6833 Body mass index (BMI) 33.0-33.9, adult: Secondary | ICD-10-CM | POA: Diagnosis not present

## 2019-11-30 DIAGNOSIS — Z6833 Body mass index (BMI) 33.0-33.9, adult: Secondary | ICD-10-CM | POA: Diagnosis not present

## 2019-11-30 DIAGNOSIS — I10 Essential (primary) hypertension: Secondary | ICD-10-CM | POA: Diagnosis not present

## 2019-11-30 DIAGNOSIS — Z299 Encounter for prophylactic measures, unspecified: Secondary | ICD-10-CM | POA: Diagnosis not present

## 2019-11-30 DIAGNOSIS — Z713 Dietary counseling and surveillance: Secondary | ICD-10-CM | POA: Diagnosis not present

## 2019-12-14 DIAGNOSIS — Z299 Encounter for prophylactic measures, unspecified: Secondary | ICD-10-CM | POA: Diagnosis not present

## 2019-12-14 DIAGNOSIS — I10 Essential (primary) hypertension: Secondary | ICD-10-CM | POA: Diagnosis not present

## 2019-12-14 DIAGNOSIS — Z6833 Body mass index (BMI) 33.0-33.9, adult: Secondary | ICD-10-CM | POA: Diagnosis not present

## 2019-12-14 DIAGNOSIS — M79605 Pain in left leg: Secondary | ICD-10-CM | POA: Diagnosis not present

## 2019-12-20 DIAGNOSIS — M1611 Unilateral primary osteoarthritis, right hip: Secondary | ICD-10-CM | POA: Diagnosis not present

## 2019-12-20 DIAGNOSIS — M25552 Pain in left hip: Secondary | ICD-10-CM | POA: Diagnosis not present

## 2020-01-31 DIAGNOSIS — Z1231 Encounter for screening mammogram for malignant neoplasm of breast: Secondary | ICD-10-CM | POA: Diagnosis not present

## 2020-02-04 DIAGNOSIS — R519 Headache, unspecified: Secondary | ICD-10-CM | POA: Diagnosis not present

## 2020-02-04 DIAGNOSIS — R9431 Abnormal electrocardiogram [ECG] [EKG]: Secondary | ICD-10-CM | POA: Diagnosis not present

## 2020-02-04 DIAGNOSIS — I10 Essential (primary) hypertension: Secondary | ICD-10-CM | POA: Diagnosis not present

## 2020-02-04 DIAGNOSIS — I119 Hypertensive heart disease without heart failure: Secondary | ICD-10-CM | POA: Diagnosis not present

## 2020-02-04 DIAGNOSIS — Z87891 Personal history of nicotine dependence: Secondary | ICD-10-CM | POA: Diagnosis not present

## 2020-02-04 DIAGNOSIS — Z79899 Other long term (current) drug therapy: Secondary | ICD-10-CM | POA: Diagnosis not present

## 2020-02-05 DIAGNOSIS — R569 Unspecified convulsions: Secondary | ICD-10-CM | POA: Diagnosis not present

## 2020-02-05 DIAGNOSIS — I10 Essential (primary) hypertension: Secondary | ICD-10-CM | POA: Diagnosis not present

## 2020-02-05 DIAGNOSIS — Z6833 Body mass index (BMI) 33.0-33.9, adult: Secondary | ICD-10-CM | POA: Diagnosis not present

## 2020-02-05 DIAGNOSIS — E7800 Pure hypercholesterolemia, unspecified: Secondary | ICD-10-CM | POA: Diagnosis not present

## 2020-02-05 DIAGNOSIS — Z299 Encounter for prophylactic measures, unspecified: Secondary | ICD-10-CM | POA: Diagnosis not present

## 2020-02-05 DIAGNOSIS — Z87891 Personal history of nicotine dependence: Secondary | ICD-10-CM | POA: Diagnosis not present

## 2020-02-08 NOTE — H&P (Signed)
TOTAL HIP ADMISSION H&P  Patient is admitted for right total hip arthroplasty.  Subjective:  Chief Complaint: Right hip pain  HPI: Tracy Lawson, 70 y.o. female, has a history of pain and functional disability in the right hip due to arthritis and patient has failed non-surgical conservative treatments for greater than 12 weeks to include NSAID's and/or analgesics and activity modification. Onset of symptoms was gradual, starting several years ago with gradually worsening course since that time. The patient noted no past surgery on the right hip. Patient currently rates pain in the right hip at 4 out of 10 with activity. Patient has pain that interfers with activities of daily living and pain with passive range of motion. AP pelvis, AP and lateral X-rays of the right hip dated 12/20/2019, demonstrated severe end-stage arthritis with severe pincer impingement. She has massive osteophyte formation and has bone-on-bone changes on the right. On the left, she has mild changes with osteophytes.  This condition presents safety issues increasing the risk of falls. There is no current active infection.  Patient Active Problem List   Diagnosis Date Noted  . Encounter for therapeutic drug monitoring 04/01/2016  . Complex partial seizures with consciousness impaired (HCC) 04/01/2016  . Essential hypertension 02/07/2014  . Complex partial seizure (HCC) 02/07/2014  . Generalized seizure disorder (HCC) 02/06/2013    Past Medical History:  Diagnosis Date  . Localization-related (focal) (partial) epilepsy and epileptic syndromes with complex partial seizures, without mention of intractable epilepsy   . Pain in limb   . Seizures (HCC)   . Sleep disturbance, unspecified   . Unspecified essential hypertension     No past surgical history on file.  Prior to Admission medications   Medication Sig Start Date End Date Taking? Authorizing Provider  amLODipine (NORVASC) 10 MG tablet Take 10 mg by mouth  daily.    [provider]  aspirin 81 MG tablet Take 81 mg by mouth daily.    [provider]  atenolol (TENORMIN) 100 MG tablet Take 100 mg by mouth 2 (two) times daily.    [provider]  atorvastatin (LIPITOR) 20 MG tablet Take 20 mg by mouth daily.    [provider]  carbamazepine (CARBATROL) 300 MG 12 hr capsule TAKE ONE CAPSULE BY MOUTH IN IN THE MORNING AND TWO AT BEDTIME 05/14/19   Butch Penny, NP  cloNIDine (CATAPRES) 0.2 MG tablet Take 0.1 mg by mouth 2 (two) times daily.     [provider]  furosemide (LASIX) 80 MG tablet Take 40 mg by mouth 2 (two) times daily.     [provider]  levETIRAcetam (KEPPRA) 750 MG tablet Take 1 tablet (750 mg total) by mouth every 12 (twelve) hours. 05/14/19   Butch Penny, NP  potassium chloride SA (K-DUR,KLOR-CON) 20 MEQ tablet Take 20 mEq by mouth daily.    [provider]    No Known Allergies  Social History   Socioeconomic History  . Marital status: Married    Spouse name: Chrissie Noa  . Number of children: 2  . Years of education: 75  . Highest education level: Not on file  Occupational History  . Not on file  Tobacco Use  . Smoking status: Former Smoker    Types: Cigarettes    Quit date: 07/27/1992    Years since quitting: 27.5  . Smokeless tobacco: Never Used  Substance and Sexual Activity  . Alcohol use: No  . Drug use: No  . Sexual activity: Not on file  Other Topics Concern  . Not on file  Social History Narrative   Patient is married Chrissie Noa) and lives with her husband, foster children and adopted children.   Patient is retired.   Patient has a college education.   Patient is right-handed.   Patient does not drink any caffeine.   Patient has two children.   Social Determinants of Health   Financial Resource Strain: Not on file  Food Insecurity: Not on file  Transportation Needs: Not on file  Physical Activity: Not on file  Stress: Not on file   Social Connections: Not on file  Intimate Partner Violence: Not on file    Tobacco Use: Medium Risk  . Smoking Tobacco Use: Former Smoker  . Smokeless Tobacco Use: Never Used   Social History   Substance and Sexual Activity  Alcohol Use No    Family History  Problem Relation Age of Onset  . Stroke Father   . Heart failure Mother   . Stroke Sister     Review of Systems  Constitutional: Negative for chills, fever and weight loss.  Respiratory: Negative for cough, shortness of breath and wheezing.   Cardiovascular: Negative for chest pain, palpitations, claudication and leg swelling.  Gastrointestinal: Negative for nausea and vomiting.  Musculoskeletal: Positive for back pain and joint pain.  Neurological: Negative for dizziness, tingling and headaches.     Objective:  Physical Exam: Well nourished and well developed.  General: Alert and oriented x3, cooperative and pleasant, no acute distress.  Head: normocephalic, atraumatic, neck supple.  Eyes: EOMI.  Respiratory: breath sounds clear in all fields, no wheezing, rales, or rhonchi. Cardiovascular: Regular rate and rhythm, no murmurs, gallops or rubs.  Abdomen: non-tender to palpation and soft, normoactive bowel sounds. Musculoskeletal:  Significantly antalgic gait pattern favoring the right side without the use of assisted devices.    Right Hip Exam:  ROM: Flexion to 100, Internal Rotation 0, External Rotation 0, and abduction 10 without discomfort.  There is no tenderness over the greater trochanter bursa.    Right Knee Exam:  No effusion. No Swelling.  Range of motion is 0-125 degrees.  Positive crepitus on range of motion of the knee.  No medial joint line tenderness  No lateral joint line tenderness.  Stable knee.    Left Hip Exam:  ROM: Normal without discomfort.  There is no tenderness over the greater trochanter bursa.   Vital signs in last 24 hours: BP: ()/()  Arterial Line BP: ()/()   Imaging  Review AP pelvis, AP and lateral X-rays of the right hip dated 12/20/2019, demonstrated severe end-stage arthritis with severe pincer impingement. She has massive osteophyte formation and has bone-on-bone changes on the right. On the left, she has mild changes with osteophytes.  Assessment/Plan:  End stage arthritis, right hip  The patient history, physical examination, clinical judgement of the provider and imaging studies are consistent with end stage degenerative joint disease of the right hip and total hip arthroplasty is deemed medically necessary. The treatment options including medical management, injection therapy, arthroscopy and arthroplasty were discussed at length. The risks and benefits of total hip arthroplasty were presented and reviewed. The risks due to aseptic loosening, infection, stiffness, dislocation/subluxation, thromboembolic complications and other imponderables were discussed. The patient acknowledged the explanation, agreed to proceed with the plan and consent was signed. Patient is being admitted for inpatient treatment for surgery, pain control, PT, OT, prophylactic antibiotics, VTE prophylaxis, progressive ambulation and ADLs and discharge planning.The patient is planning  to be discharged home.   Patient's anticipated LOS is less than 2 midnights, meeting these requirements: - Lives within 1 hour of care - Has a competent adult at home to recover with post-op recover - NO history of  - Chronic pain requiring opiods  - Diabetes  - Coronary Artery Disease  - Heart failure  - Heart attack  - Stroke  - DVT/VTE  - Cardiac arrhythmia  - Respiratory Failure/COPD  - Renal failure  - Anemia  - Advanced Liver disease  Therapy Plans: HEP Disposition: Home w/husband or daughter in law Planned DVT Prophylaxis: Aspirin 325mg  DME Needed: 3-in-1 PCP: Dr. (Clearance not received) TXA: IV Allergies: NKDA Anesthesia Concerns: None BMI: 32.4 Last HgbA1c:  N/A  Pharmacy: [facility]   Other: History of seizures following hypertensive crisis prior to delivery of second child 28 years ago.   - Patient was instructed on what medications to stop prior to surgery. - Follow-up visit in 2 weeks with Dr. Kirstie Peri - Begin physical therapy following surgery - Pre-operative lab work as pre-surgical testing - Prescriptions will be provided in hospital at time of discharge  Lequita Halt, [facility], PA-C Orthopedic Surgery [facility] [place]

## 2020-02-19 DIAGNOSIS — Z299 Encounter for prophylactic measures, unspecified: Secondary | ICD-10-CM | POA: Diagnosis not present

## 2020-02-19 DIAGNOSIS — Z87891 Personal history of nicotine dependence: Secondary | ICD-10-CM | POA: Diagnosis not present

## 2020-02-19 DIAGNOSIS — Z6833 Body mass index (BMI) 33.0-33.9, adult: Secondary | ICD-10-CM | POA: Diagnosis not present

## 2020-02-19 DIAGNOSIS — I10 Essential (primary) hypertension: Secondary | ICD-10-CM | POA: Diagnosis not present

## 2020-02-19 DIAGNOSIS — R350 Frequency of micturition: Secondary | ICD-10-CM | POA: Diagnosis not present

## 2020-02-25 ENCOUNTER — Encounter (HOSPITAL_COMMUNITY): Admission: RE | Admit: 2020-02-25 | Payer: Self-pay | Source: Ambulatory Visit

## 2020-02-26 DIAGNOSIS — Z87891 Personal history of nicotine dependence: Secondary | ICD-10-CM | POA: Diagnosis not present

## 2020-02-26 DIAGNOSIS — I10 Essential (primary) hypertension: Secondary | ICD-10-CM | POA: Diagnosis not present

## 2020-02-26 DIAGNOSIS — Z6833 Body mass index (BMI) 33.0-33.9, adult: Secondary | ICD-10-CM | POA: Diagnosis not present

## 2020-02-26 DIAGNOSIS — Z713 Dietary counseling and surveillance: Secondary | ICD-10-CM | POA: Diagnosis not present

## 2020-02-26 DIAGNOSIS — Z299 Encounter for prophylactic measures, unspecified: Secondary | ICD-10-CM | POA: Diagnosis not present

## 2020-03-05 ENCOUNTER — Ambulatory Visit: Admit: 2020-03-05 | Payer: Self-pay | Admitting: Orthopedic Surgery

## 2020-03-05 SURGERY — ARTHROPLASTY, HIP, TOTAL, ANTERIOR APPROACH
Anesthesia: Choice | Site: Hip | Laterality: Right

## 2020-03-06 DIAGNOSIS — I10 Essential (primary) hypertension: Secondary | ICD-10-CM | POA: Diagnosis not present

## 2020-03-10 DIAGNOSIS — I10 Essential (primary) hypertension: Secondary | ICD-10-CM | POA: Diagnosis not present

## 2020-03-12 DIAGNOSIS — Z299 Encounter for prophylactic measures, unspecified: Secondary | ICD-10-CM | POA: Diagnosis not present

## 2020-03-12 DIAGNOSIS — Z6833 Body mass index (BMI) 33.0-33.9, adult: Secondary | ICD-10-CM | POA: Diagnosis not present

## 2020-03-12 DIAGNOSIS — I10 Essential (primary) hypertension: Secondary | ICD-10-CM | POA: Diagnosis not present

## 2020-03-12 DIAGNOSIS — Z87891 Personal history of nicotine dependence: Secondary | ICD-10-CM | POA: Diagnosis not present

## 2020-03-12 DIAGNOSIS — Z713 Dietary counseling and surveillance: Secondary | ICD-10-CM | POA: Diagnosis not present

## 2020-03-27 DIAGNOSIS — Z6832 Body mass index (BMI) 32.0-32.9, adult: Secondary | ICD-10-CM | POA: Diagnosis not present

## 2020-03-27 DIAGNOSIS — Z6833 Body mass index (BMI) 33.0-33.9, adult: Secondary | ICD-10-CM | POA: Diagnosis not present

## 2020-03-27 DIAGNOSIS — Z713 Dietary counseling and surveillance: Secondary | ICD-10-CM | POA: Diagnosis not present

## 2020-03-27 DIAGNOSIS — M1711 Unilateral primary osteoarthritis, right knee: Secondary | ICD-10-CM | POA: Diagnosis not present

## 2020-03-27 DIAGNOSIS — I10 Essential (primary) hypertension: Secondary | ICD-10-CM | POA: Diagnosis not present

## 2020-03-27 DIAGNOSIS — Z299 Encounter for prophylactic measures, unspecified: Secondary | ICD-10-CM | POA: Diagnosis not present

## 2020-04-02 DIAGNOSIS — M170 Bilateral primary osteoarthritis of knee: Secondary | ICD-10-CM | POA: Diagnosis not present

## 2020-04-02 DIAGNOSIS — M1711 Unilateral primary osteoarthritis, right knee: Secondary | ICD-10-CM | POA: Diagnosis not present

## 2020-04-02 DIAGNOSIS — M25761 Osteophyte, right knee: Secondary | ICD-10-CM | POA: Diagnosis not present

## 2020-04-02 DIAGNOSIS — M25562 Pain in left knee: Secondary | ICD-10-CM | POA: Diagnosis not present

## 2020-04-02 DIAGNOSIS — M222X1 Patellofemoral disorders, right knee: Secondary | ICD-10-CM | POA: Diagnosis not present

## 2020-04-02 DIAGNOSIS — M222X2 Patellofemoral disorders, left knee: Secondary | ICD-10-CM | POA: Diagnosis not present

## 2020-04-02 DIAGNOSIS — M25762 Osteophyte, left knee: Secondary | ICD-10-CM | POA: Diagnosis not present

## 2020-04-02 DIAGNOSIS — M1712 Unilateral primary osteoarthritis, left knee: Secondary | ICD-10-CM | POA: Diagnosis not present

## 2020-04-02 DIAGNOSIS — M25561 Pain in right knee: Secondary | ICD-10-CM | POA: Diagnosis not present

## 2020-04-09 DIAGNOSIS — M1711 Unilateral primary osteoarthritis, right knee: Secondary | ICD-10-CM | POA: Diagnosis not present

## 2020-04-09 DIAGNOSIS — M1712 Unilateral primary osteoarthritis, left knee: Secondary | ICD-10-CM | POA: Diagnosis not present

## 2020-04-10 DIAGNOSIS — I10 Essential (primary) hypertension: Secondary | ICD-10-CM | POA: Diagnosis not present

## 2020-04-16 DIAGNOSIS — M170 Bilateral primary osteoarthritis of knee: Secondary | ICD-10-CM | POA: Diagnosis not present

## 2020-04-16 DIAGNOSIS — M1711 Unilateral primary osteoarthritis, right knee: Secondary | ICD-10-CM | POA: Diagnosis not present

## 2020-04-16 DIAGNOSIS — M1712 Unilateral primary osteoarthritis, left knee: Secondary | ICD-10-CM | POA: Diagnosis not present

## 2020-04-23 DIAGNOSIS — M1711 Unilateral primary osteoarthritis, right knee: Secondary | ICD-10-CM | POA: Diagnosis not present

## 2020-04-23 DIAGNOSIS — M1712 Unilateral primary osteoarthritis, left knee: Secondary | ICD-10-CM | POA: Diagnosis not present

## 2020-05-07 DIAGNOSIS — M1711 Unilateral primary osteoarthritis, right knee: Secondary | ICD-10-CM | POA: Diagnosis not present

## 2020-05-07 DIAGNOSIS — M170 Bilateral primary osteoarthritis of knee: Secondary | ICD-10-CM | POA: Diagnosis not present

## 2020-05-07 DIAGNOSIS — M1712 Unilateral primary osteoarthritis, left knee: Secondary | ICD-10-CM | POA: Diagnosis not present

## 2020-05-09 DIAGNOSIS — I10 Essential (primary) hypertension: Secondary | ICD-10-CM | POA: Diagnosis not present

## 2020-05-14 ENCOUNTER — Ambulatory Visit: Payer: Medicare PPO | Admitting: Adult Health

## 2020-05-21 DIAGNOSIS — M1712 Unilateral primary osteoarthritis, left knee: Secondary | ICD-10-CM | POA: Diagnosis not present

## 2020-05-21 DIAGNOSIS — M1711 Unilateral primary osteoarthritis, right knee: Secondary | ICD-10-CM | POA: Diagnosis not present

## 2020-05-21 DIAGNOSIS — M2351 Chronic instability of knee, right knee: Secondary | ICD-10-CM | POA: Diagnosis not present

## 2020-05-21 DIAGNOSIS — M170 Bilateral primary osteoarthritis of knee: Secondary | ICD-10-CM | POA: Diagnosis not present

## 2020-05-21 DIAGNOSIS — M2352 Chronic instability of knee, left knee: Secondary | ICD-10-CM | POA: Diagnosis not present

## 2020-06-03 DIAGNOSIS — Z01419 Encounter for gynecological examination (general) (routine) without abnormal findings: Secondary | ICD-10-CM | POA: Diagnosis not present

## 2020-06-03 DIAGNOSIS — I10 Essential (primary) hypertension: Secondary | ICD-10-CM | POA: Diagnosis not present

## 2020-06-10 DIAGNOSIS — I10 Essential (primary) hypertension: Secondary | ICD-10-CM | POA: Diagnosis not present

## 2020-06-16 ENCOUNTER — Encounter: Payer: Self-pay | Admitting: Adult Health

## 2020-06-16 ENCOUNTER — Ambulatory Visit (INDEPENDENT_AMBULATORY_CARE_PROVIDER_SITE_OTHER): Payer: Medicare PPO | Admitting: Adult Health

## 2020-06-16 VITALS — BP 218/97 | HR 65 | Ht 65.0 in | Wt 195.0 lb

## 2020-06-16 DIAGNOSIS — G40209 Localization-related (focal) (partial) symptomatic epilepsy and epileptic syndromes with complex partial seizures, not intractable, without status epilepticus: Secondary | ICD-10-CM

## 2020-06-16 DIAGNOSIS — Z5181 Encounter for therapeutic drug level monitoring: Secondary | ICD-10-CM | POA: Diagnosis not present

## 2020-06-16 NOTE — Progress Notes (Signed)
PATIENT: Tracy Lawson DOB: [birth year]  REASON FOR VISIT: follow up HISTORY FROM: patient PRIMARY NEUROLOGIST: [name]  HISTORY OF PRESENT ILLNESS: Today 06/16/20:  Tracy Lawson is a 70 year old female with a history of seizures.  She returns today for follow-up.  She remains on Keppra 750 mg twice a day and carbamazepine 300 mg in the morning and 600 mg in the evening.  She denies any seizure events.  Reports that she tolerates the medication well.  She operates a Librarian, academic without difficulty.  Blood pressure is elevated today she reports that she did not take her medication as it makes her drowsy.  She plans to take her medication as soon as she gets home.  05/14/19: Tracy Lawson is a 70 year old female with a history of seizures.  She returns today for follow-up.  She remains on Keppra and carbamazepine.  She denies any seizure events.  Denies any changes with her gait or balance.  She operates a Librarian, academic.  She is able to complete all ADLs independently.  She lives at home with her spouse and 2 children.  She returns today for an evaluation.  HISTORY HPI: Is a long established patient of [name].  She was last seen in the office by my colleague, [name] on 07-27-12 after Dr. Imagene Gurney retirement. At the time of her presentation she had a fairly high blood pressure of 181/87 mmHg with a low pulse rate of 50. The patient is on clonidine, but her main neurologic complaint is a long-standing seizure disorder which has been well controlled with medications. She is unaccompanied today as the seizures were described as simple partial at onset with some complex secondary generalization. Her first seizure was in 1993. The seizure occurred right after the birth of her second child, at 80 months gestational age. The pregnancy was complicated by toxemia and PRES. Initially treated with phenytoin she developed gum swelling and was switched to carbamazepine under which he become very  sleepy, ataxic unsteady in her gait. They were seizures while on the medication, but she describes as pronounced by myoclonic twitches and speech arrest. Under her current regimen of Keppra and carbamazepine, she feels she's doing very well, she does however have a history of poorly controlled hypertension in the past. No aura , diplopia and no known hyponatremia.  She has no new complaints today. She denies any recent illness, aura, seizure, problems with her medication or side effects. There is a new entry to her family history , her sister died of a Stroke in decemeber 200[address] , at age 50, her younger sister had a stroke, but is cared for at home.   REVIEW OF SYSTEMS: Out of a complete 14 system review of symptoms, the patient complains only of the following symptoms, and all other reviewed systems are negative.  See HPI  ALLERGIES: No Known Allergies  HOME MEDICATIONS: Outpatient Medications Prior to Visit  Medication Sig Dispense Refill  . amLODipine (NORVASC) 10 MG tablet Take 10 mg by mouth daily.    Marland Kitchen aspirin 81 MG tablet Take 81 mg by mouth daily.    Marland Kitchen atorvastatin (LIPITOR) 20 MG tablet Take 20 mg by mouth daily.    . Calcium Carbonate-Vitamin D3 600-400 MG-UNIT TABS Take 1 tablet by mouth daily.    . carbamazepine (CARBATROL) 300 MG 12 hr capsule TAKE ONE CAPSULE BY MOUTH IN IN THE MORNING AND TWO AT BEDTIME (Patient taking differently: Take 300-600 mg by mouth See admin instructions.  TAKE ONE CAPSULE (300 mg ) BY MOUTH IN IN THE MORNING AND TWO (600 mg) AT BEDTIME) 270 capsule 3  . cloNIDine (CATAPRES) 0.3 MG tablet Take 0.3 mg by mouth 2 (two) times daily.    . furosemide (LASIX) 40 MG tablet Take 40 mg by mouth 2 (two) times daily.     Marland Kitchen labetalol (NORMODYNE) 200 MG tablet Take 100 mg by mouth 2 (two) times daily.    Marland Kitchen levETIRAcetam (KEPPRA) 750 MG tablet Take 1 tablet (750 mg total) by mouth every 12 (twelve) hours. 180 tablet 3  . potassium chloride SA (K-DUR,KLOR-CON) 20  MEQ tablet Take 20 mEq by mouth daily.    . Vitamin D, Ergocalciferol, (DRISDOL) 1.25 MG (50000 UNIT) CAPS capsule Take 50,000 Units by mouth every Monday.    . zinc gluconate 50 MG tablet Take 50 mg by mouth daily.    Marland Kitchen oxymetazoline (AFRIN) 0.05 % nasal spray Place 1 spray into both nostrils at bedtime as needed for congestion. Sinus Severe     No facility-administered medications prior to visit.    PAST MEDICAL HISTORY: Past Medical History:  Diagnosis Date  . Localization-related (focal) (partial) epilepsy and epileptic syndromes with complex partial seizures, without mention of intractable epilepsy   . Pain in limb   . Seizures (HCC)   . Sleep disturbance, unspecified   . Unspecified essential hypertension     PAST SURGICAL HISTORY: History reviewed. No pertinent surgical history.  FAMILY HISTORY: Family History  Problem Relation Age of Onset  . Stroke Father   . Heart failure Mother   . Stroke Sister     SOCIAL HISTORY: Social History   Socioeconomic History  . Marital status: Married    Spouse name: Chrissie Noa  . Number of children: 2  . Years of education: 80  . Highest education level: Not on file  Occupational History  . Not on file  Tobacco Use  . Smoking status: Former Smoker    Types: Cigarettes    Quit date: 07/27/1992    Years since quitting: 27.9  . Smokeless tobacco: Never Used  Substance and Sexual Activity  . Alcohol use: No  . Drug use: No  . Sexual activity: Not on file  Other Topics Concern  . Not on file  Social History Narrative   Patient is married Chrissie Noa) and lives with her husband, foster children and adopted children.   Patient is retired.   Patient has a college education.   Patient is right-handed.   Patient does not drink any caffeine.   Patient has two children.   Social Determinants of Health   Financial Resource Strain: Not on file  Food Insecurity: Not on file  Transportation Needs: Not on file  Physical Activity: Not  on file  Stress: Not on file  Social Connections: Not on file  Intimate Partner Violence: Not on file      PHYSICAL EXAM  Vitals:   06/16/20 0952  BP: (!) 218/97  Pulse: 65  Weight: 195 lb (88.5 kg)  Height: 5\' 5"  (1.651 m)   Body mass index is 32.45 kg/m.  Generalized: Well developed, in no acute distress   Neurological examination  Mentation: Alert oriented to time, place, history taking. Follows all commands speech and language fluent Cranial nerve II-XII: Pupils were equal round reactive to light. Extraocular movements were full, visual field were full on confrontational test. Head turning and shoulder shrug  were normal and symmetric. Motor: The motor testing reveals 5 over 5  strength of all 4 extremities. Good symmetric motor tone is noted throughout.  Sensory: Sensory testing is intact to soft touch on all 4 extremities. No evidence of extinction is noted.  Coordination: Cerebellar testing reveals good finger-nose-finger and heel-to-shin bilaterally.  Gait and station: Gait is normal.  Reflexes: Deep tendon reflexes are symmetric and normal bilaterally.   DIAGNOSTIC DATA (LABS, IMAGING, TESTING) - I reviewed patient records, labs, notes, testing and imaging myself where available.  Lab Results  Component Value Date   WBC 3.6 05/14/2019   HGB 12.7 05/14/2019   HCT 38.4 05/14/2019   MCV 92 05/14/2019   PLT 194 05/14/2019      Component Value Date/Time   NA 146 (H) 05/14/2019 0954   K 4.1 05/14/2019 0954   CL 107 (H) 05/14/2019 0954   CO2 26 05/14/2019 0954   GLUCOSE 113 (H) 05/14/2019 0954   BUN 13 05/14/2019 0954   CREATININE 0.80 05/14/2019 0954   CALCIUM 8.9 05/14/2019 0954   PROT 6.8 05/14/2019 0954   ALBUMIN 3.8 05/14/2019 0954   AST 21 05/14/2019 0954   ALT 14 05/14/2019 0954   ALKPHOS 111 05/14/2019 0954   BILITOT <0.2 05/14/2019 0954   GFRNONAA 77 05/14/2019 0954   GFRAA 88 05/14/2019 0954      ASSESSMENT AND PLAN 70 y.o. year old female   has a past medical history of Localization-related (focal) (partial) epilepsy and epileptic syndromes with complex partial seizures, without mention of intractable epilepsy, Pain in limb, Seizures (HCC), Sleep disturbance, unspecified, and Unspecified essential hypertension. here with:  1.  Seizures  -Continue carbamazepine 300 mg in a.m. and 600 mg at bedtime -Continue Keppra 750 mg twice a day -Blood work today -Blood pressure is elevated today she has not taken her morning medication.  Advised that she should go home and take her medication and keep a check on her blood pressure.  If it remains elevated she should go see her primary care. -Advised if she has any seizure event she should let us know -Follow-up in 1 year or sooner if needed    Butch Penny, MSN, NP-C 06/16/2020, 9:53 AM [facility] [address], [address] [place], [place] [address] [phone]

## 2020-06-16 NOTE — Patient Instructions (Addendum)
Continue Keppra 750 mg twice a day Continue carbamazepine 300 mg in the morning and 600 mg in the evening Blood work today If you have any seizure events please let us know.

## 2020-06-17 LAB — COMPREHENSIVE METABOLIC PANEL
ALT: 13 IU/L (ref 0–32)
AST: 20 IU/L (ref 0–40)
Albumin/Globulin Ratio: 1.3 (ref 1.2–2.2)
Albumin: 4.4 g/dL (ref 3.8–4.8)
Alkaline Phosphatase: 125 IU/L — ABNORMAL HIGH (ref 44–121)
BUN/Creatinine Ratio: 20 (ref 12–28)
BUN: 17 mg/dL (ref 8–27)
Bilirubin Total: 0.2 mg/dL (ref 0.0–1.2)
CO2: 27 mmol/L (ref 20–29)
Calcium: 9.5 mg/dL (ref 8.7–10.3)
Chloride: 103 mmol/L (ref 96–106)
Creatinine, Ser: 0.83 mg/dL (ref 0.57–1.00)
Globulin, Total: 3.4 g/dL (ref 1.5–4.5)
Glucose: 118 mg/dL — ABNORMAL HIGH (ref 65–99)
Potassium: 4.4 mmol/L (ref 3.5–5.2)
Sodium: 142 mmol/L (ref 134–144)
Total Protein: 7.8 g/dL (ref 6.0–8.5)
eGFR: 76 mL/min/{1.73_m2} (ref >59)

## 2020-06-17 LAB — CBC WITH DIFFERENTIAL/PLATELET
Basophils Absolute: 0 10*3/uL (ref 0.0–0.2)
Basos: 0 %
EOS (ABSOLUTE): 0 10*3/uL (ref 0.0–0.4)
Eos: 0 %
Hematocrit: 39.1 % (ref 34.0–46.6)
Hemoglobin: 13.2 g/dL (ref 11.1–15.9)
Immature Grans (Abs): 0 10*3/uL (ref 0.0–0.1)
Immature Granulocytes: 0 %
Lymphocytes Absolute: 1.3 10*3/uL (ref 0.7–3.1)
Lymphs: 32 %
MCH: 30.6 pg (ref 26.6–33.0)
MCHC: 33.8 g/dL (ref 31.5–35.7)
MCV: 91 fL (ref 79–97)
Monocytes Absolute: 0.4 10*3/uL (ref 0.1–0.9)
Monocytes: 10 %
Neutrophils Absolute: 2.4 10*3/uL (ref 1.4–7.0)
Neutrophils: 58 %
Platelets: 232 10*3/uL (ref 150–450)
RBC: 4.31 x10E6/uL (ref 3.77–5.28)
RDW: 12.7 % (ref 11.7–15.4)
WBC: 4.1 10*3/uL (ref 3.4–10.8)

## 2020-06-17 LAB — CARBAMAZEPINE LEVEL, TOTAL: Carbamazepine (Tegretol), S: 10.7 ug/mL (ref 4.0–12.0)

## 2020-06-18 ENCOUNTER — Telehealth: Payer: Self-pay

## 2020-06-18 NOTE — Telephone Encounter (Signed)
-----   Message from Butch Penny, NP sent at 06/18/2020  8:28 AM EDT ----- Blood work is relatively unremarkable.  Please call patient with results

## 2020-06-18 NOTE — Telephone Encounter (Signed)
Contacted pt to inform her that labs look relatively unremarkable with no concerns at the time, advised to call the office with questions as she had none at the time. She understood.

## 2020-06-21 ENCOUNTER — Other Ambulatory Visit: Payer: Self-pay | Admitting: Adult Health

## 2020-06-21 DIAGNOSIS — G40209 Localization-related (focal) (partial) symptomatic epilepsy and epileptic syndromes with complex partial seizures, not intractable, without status epilepticus: Secondary | ICD-10-CM

## 2020-07-01 DIAGNOSIS — M1711 Unilateral primary osteoarthritis, right knee: Secondary | ICD-10-CM | POA: Diagnosis not present

## 2020-07-01 DIAGNOSIS — I10 Essential (primary) hypertension: Secondary | ICD-10-CM | POA: Diagnosis not present

## 2020-07-01 DIAGNOSIS — Z713 Dietary counseling and surveillance: Secondary | ICD-10-CM | POA: Diagnosis not present

## 2020-07-01 DIAGNOSIS — Z6832 Body mass index (BMI) 32.0-32.9, adult: Secondary | ICD-10-CM | POA: Diagnosis not present

## 2020-07-01 DIAGNOSIS — Z299 Encounter for prophylactic measures, unspecified: Secondary | ICD-10-CM | POA: Diagnosis not present

## 2020-07-08 DIAGNOSIS — Z23 Encounter for immunization: Secondary | ICD-10-CM | POA: Diagnosis not present

## 2020-07-10 DIAGNOSIS — I10 Essential (primary) hypertension: Secondary | ICD-10-CM | POA: Diagnosis not present

## 2020-07-24 DIAGNOSIS — M25561 Pain in right knee: Secondary | ICD-10-CM | POA: Diagnosis not present

## 2020-07-24 DIAGNOSIS — M1611 Unilateral primary osteoarthritis, right hip: Secondary | ICD-10-CM | POA: Diagnosis not present

## 2020-07-25 DIAGNOSIS — M1711 Unilateral primary osteoarthritis, right knee: Secondary | ICD-10-CM | POA: Diagnosis not present

## 2020-07-25 DIAGNOSIS — R011 Cardiac murmur, unspecified: Secondary | ICD-10-CM | POA: Diagnosis not present

## 2020-07-25 DIAGNOSIS — R569 Unspecified convulsions: Secondary | ICD-10-CM | POA: Diagnosis not present

## 2020-07-25 DIAGNOSIS — I10 Essential (primary) hypertension: Secondary | ICD-10-CM | POA: Diagnosis not present

## 2020-07-25 DIAGNOSIS — Z299 Encounter for prophylactic measures, unspecified: Secondary | ICD-10-CM | POA: Diagnosis not present

## 2020-07-25 DIAGNOSIS — Z6832 Body mass index (BMI) 32.0-32.9, adult: Secondary | ICD-10-CM | POA: Diagnosis not present

## 2020-07-25 DIAGNOSIS — R030 Elevated blood-pressure reading, without diagnosis of hypertension: Secondary | ICD-10-CM | POA: Diagnosis not present

## 2020-08-08 ENCOUNTER — Ambulatory Visit (INDEPENDENT_AMBULATORY_CARE_PROVIDER_SITE_OTHER): Payer: Medicare PPO | Admitting: Podiatry

## 2020-08-08 ENCOUNTER — Other Ambulatory Visit: Payer: Self-pay

## 2020-08-08 ENCOUNTER — Ambulatory Visit (INDEPENDENT_AMBULATORY_CARE_PROVIDER_SITE_OTHER): Payer: Medicare PPO

## 2020-08-08 ENCOUNTER — Other Ambulatory Visit: Payer: Self-pay | Admitting: Podiatry

## 2020-08-08 DIAGNOSIS — M79671 Pain in right foot: Secondary | ICD-10-CM

## 2020-08-08 DIAGNOSIS — M7661 Achilles tendinitis, right leg: Secondary | ICD-10-CM

## 2020-08-08 DIAGNOSIS — I10 Essential (primary) hypertension: Secondary | ICD-10-CM | POA: Diagnosis not present

## 2020-08-08 MED ORDER — MELOXICAM 15 MG PO TABS
15.0000 mg | ORAL_TABLET | Freq: Every day | ORAL | 0 refills | Status: DC
Start: 2020-08-08 — End: 2020-09-09

## 2020-08-08 NOTE — Progress Notes (Signed)
  Subjective:  Patient ID: Tracy Lawson, female    DOB: [birth year],  MRN: [id number]  No chief complaint on file.  70 y.o. female presents with the above complaint. History confirmed with patient. Reports pain in the right posterior heel for 2 weeks. Denies injury.   Objective:  Physical Exam: warm, good capillary refill, no trophic changes or ulcerative lesions, normal DP and PT pulses, and normal sensory exam.  Right Foot: tenderness to palpation posterior calcaneus, no pain with calcaneal squeeze, decreased ankle joint ROM, and +Silverskiold test normal exam, no swelling, tenderness, instability; ligaments intact, full range of motion of all ankle/foot joints other than findings noted above.  Radiographs: X-ray of the right foot: no evidence of calcaneal stress fracture, plantar calcaneal spur, posterior calcaneal spur, and Haglund deformity noted  Assessment:   1. Tendonitis, Achilles, right   2. Pain in right foot    Plan:  Patient was evaluated and treated and all questions answered.  Achilles Tendonitis -XR reviewed with patient -Educated on stretching and icing of the affected limb. -Night splint dispensed. -Trilock ankle brace dispensed for protection of the Achilles tendon. -Rx for Meloxicam. Advised on risks, benefits, and alternatives of the medication  No follow-ups on file.

## 2020-08-08 NOTE — Patient Instructions (Signed)

## 2020-08-11 DIAGNOSIS — R010 Benign and innocent cardiac murmurs: Secondary | ICD-10-CM | POA: Diagnosis not present

## 2020-09-09 ENCOUNTER — Ambulatory Visit (INDEPENDENT_AMBULATORY_CARE_PROVIDER_SITE_OTHER): Payer: Medicare PPO | Admitting: Podiatry

## 2020-09-09 ENCOUNTER — Other Ambulatory Visit: Payer: Self-pay

## 2020-09-09 DIAGNOSIS — M79671 Pain in right foot: Secondary | ICD-10-CM

## 2020-09-09 DIAGNOSIS — M7661 Achilles tendinitis, right leg: Secondary | ICD-10-CM | POA: Diagnosis not present

## 2020-09-09 MED ORDER — MELOXICAM 15 MG PO TABS: 15.0000 mg | ORAL_TABLET | Freq: Every day | ORAL | 0 refills | Status: DC

## 2020-09-09 NOTE — Progress Notes (Signed)
  Subjective:  Patient ID: Gregery Na, female    DOB: 1950-02-24,  MRN: [id number]  Chief Complaint  Patient presents with   Foot Pain    1 month follow up achilles tendonitis. Pt states improvement.     70 y.o. female presents with the above complaint. History confirmed with patient. Did not take the meloxicam much because she thought it was just for pain. Pain improved but still 7/10.  Objective:  Physical Exam: warm, good capillary refill, no trophic changes or ulcerative lesions, normal DP and PT pulses, and normal sensory exam.  Right Foot: tenderness to palpation posterior calcaneus, no pain with calcaneal squeeze, decreased ankle joint ROM, and +Silverskiold test. Local warmth and edema of posterior Achilles  Assessment:   1. Tendonitis, Achilles, right   2. Pain in right foot     Plan:  Patient was evaluated and treated and all questions answered.  Achilles Tendonitis  -Continued inflammation noted with local warmth and edema -Continue Trilock ankle brace dispensed for protection of the Achilles tendon. -Refill Meloxicam. Advised she would need to be off of this prior to her hip replacement for at least a week  Return in about 3 weeks (around 09/30/2020) for Tendonitis.

## 2020-09-10 DIAGNOSIS — I10 Essential (primary) hypertension: Secondary | ICD-10-CM | POA: Diagnosis not present

## 2020-09-16 NOTE — H&P (Signed)
TOTAL HIP ADMISSION H&P  Patient is admitted for right total hip arthroplasty.  Subjective:  Chief Complaint: Right hip pain  HPI: Tracy Lawson, 70 y.o. female, has a history of pain and functional disability in the right hip due to arthritis and patient has failed non-surgical conservative treatments for greater than 12 weeks to include NSAID's and/or analgesics, corticosteriod injections, and activity modification. Onset of symptoms was gradual, starting  several  years ago with gradually worsening course since that time. The patient noted no past surgery on the right hip. Patient currently rates pain in the right hip at 7 out of 10 with activity. Patient has night pain, worsening of pain with activity and weight bearing, pain that interfers with activities of daily living, and pain with passive range of motion. Patient has evidence of  severe end stage bone on bone arthritis with subchondral cyst formation  by imaging studies. This condition presents safety issues increasing the risk of falls. There is no current active infection.  Patient Active Problem List   Diagnosis Date Noted   Encounter for therapeutic drug monitoring 04/01/2016   Complex partial seizures with consciousness impaired (HCC) 04/01/2016   Essential hypertension 02/07/2014   Complex partial seizure (HCC) 02/07/2014   Generalized seizure disorder (HCC) 02/06/2013    Past Medical History:  Diagnosis Date   Localization-related (focal) (partial) epilepsy and epileptic syndromes with complex partial seizures, without mention of intractable epilepsy    Pain in limb    Seizures (HCC)    Sleep disturbance, unspecified    Unspecified essential hypertension     No past surgical history on file.  Prior to Admission medications   Medication Sig Start Date End Date Taking? Authorizing Provider  amLODipine (NORVASC) 10 MG tablet Take 10 mg by mouth daily.    [provider]  aspirin 81 MG tablet Take 81 mg by  mouth daily.    [provider]  atorvastatin (LIPITOR) 20 MG tablet Take 20 mg by mouth daily.    [provider]  Calcium Carbonate-Vitamin D3 600-400 MG-UNIT TABS Take 1 tablet by mouth daily.    [provider]  carbamazepine (CARBATROL) 300 MG 12 hr capsule TAKE 1 CAPSULE BY MOUTH EVERY MORNING AND TAKE TWO CAPSULES BY MOUTH AT BEDTIME 06/23/20   Butch Penny, NP  cloNIDine (CATAPRES) 0.3 MG tablet Take 0.3 mg by mouth 2 (two) times daily.    [provider]  furosemide (LASIX) 40 MG tablet Take 40 mg by mouth 2 (two) times daily.     [provider]  labetalol (NORMODYNE) 200 MG tablet Take 100 mg by mouth 2 (two) times daily. 02/05/20   [provider]  levETIRAcetam (KEPPRA) 750 MG tablet TAKE 1 TABLET BY MOUTH EVERY TWELVE HOURS 06/23/20   Butch Penny, NP  meloxicam (MOBIC) 15 MG tablet Take 1 tablet (15 mg total) by mouth daily. 09/09/20   Park Liter, DPM  potassium chloride SA (K-DUR,KLOR-CON) 20 MEQ tablet Take 20 mEq by mouth daily.    [provider]  Vitamin D, Ergocalciferol, (DRISDOL) 1.25 MG (50000 UNIT) CAPS capsule Take 50,000 Units by mouth every Monday.    [provider]  zinc gluconate 50 MG tablet Take 50 mg by mouth daily.    [provider]    No Known Allergies  Social History   Socioeconomic History   Marital status: Married    Spouse name: Chrissie Noa   Number of children: 2   Years of education:  14   Highest education level: Not on file  Occupational History   Not on file  Tobacco Use   Smoking status: Former    Types: Cigarettes    Quit date: 07/27/1992    Years since quitting: 28.1   Smokeless tobacco: Never  Substance and Sexual Activity   Alcohol use: No   Drug use: No   Sexual activity: Not on file  Other Topics Concern   Not on file  Social History Narrative   Patient is married Chrissie Noa) and lives with her husband, foster children and adopted children.    Patient is retired.   Patient has a college education.   Patient is right-handed.   Patient does not drink any caffeine.   Patient has two children.   Social Determinants of Health   Financial Resource Strain: Not on file  Food Insecurity: Not on file  Transportation Needs: Not on file  Physical Activity: Not on file  Stress: Not on file  Social Connections: Not on file  Intimate Partner Violence: Not on file    Tobacco Use: Medium Risk   Smoking Tobacco Use: Former   Smokeless Tobacco Use: Never   Social History   Substance and Sexual Activity  Alcohol Use No    Family History  Problem Relation Age of Onset   Stroke Father    Heart failure Mother    Stroke Sister     Review of Systems  Constitutional:  Negative for chills and fever.  HENT:  Negative for congestion, sore throat and tinnitus.   Eyes:  Negative for double vision, photophobia and pain.  Respiratory:  Negative for cough, shortness of breath and wheezing.   Cardiovascular:  Negative for chest pain, palpitations and orthopnea.  Gastrointestinal:  Negative for heartburn, nausea and vomiting.  Genitourinary:  Negative for dysuria, frequency and urgency.  Musculoskeletal:  Positive for joint pain.  Neurological:  Negative for dizziness, weakness and headaches.    Objective:  Physical Exam: Well nourished and well developed.  General: Alert and oriented x3, cooperative and pleasant, no acute distress.  Head: normocephalic, atraumatic, neck supple.  Eyes: EOMI.  Respiratory: breath sounds clear in all fields, no wheezing, rales, or rhonchi. Cardiovascular: Regular rate and rhythm, no murmurs, gallops or rubs.  Abdomen: non-tender to palpation and soft, normoactive bowel sounds. Musculoskeletal:  Right Hip Exam:  Range of motion: Flexion to 90 degrees, internal rotation to 0 degrees, external rotation to 0 degrees, and abduction to 10 degrees without discomfort.  There is no tenderness over the greater  trochanteric bursa.  Calves soft and nontender. Motor function intact in LE. Strength 5/5 LE bilaterally. Neuro: Distal pulses 2+. Sensation to light touch intact in LE.  Imaging Review Plain radiographs demonstrate severe degenerative joint disease of the right hip. The bone quality appears to be adequate for age and reported activity level.  Assessment/Plan:  End stage arthritis, right hip  The patient history, physical examination, clinical judgement of the provider and imaging studies are consistent with end stage degenerative joint disease of the right hip and total hip arthroplasty is deemed medically necessary. The treatment options including medical management, injection therapy, arthroscopy and arthroplasty were discussed at length. The risks and benefits of total hip arthroplasty were presented and reviewed. The risks due to aseptic loosening, infection, stiffness, dislocation/subluxation, thromboembolic complications and other imponderables were discussed. The patient acknowledged the explanation, agreed to proceed with the plan and consent was signed. Patient is being admitted for inpatient treatment for surgery,  pain control, PT, OT, prophylactic antibiotics, VTE prophylaxis, progressive ambulation and ADLs and discharge planning.The patient is planning to be discharged  home .   Patient's anticipated LOS is less than 2 midnights, meeting these requirements: - Lives within 1 hour of care - Has a competent adult at home to recover with post-op recover - NO history of  - Chronic pain requiring opioids  - Diabetes  - Coronary Artery Disease  - Heart failure  - Heart attack  - Stroke  - DVT/VTE  - Cardiac arrhythmia  - Respiratory Failure/COPD  - Renal failure  - Anemia  - Advanced Liver disease   Therapy Plans: HEP Disposition: Home with husband Planned DVT Prophylaxis: Aspirin 325 mg BID DME Needed: None PCP: Kirstie Peri, MD (clearance received) TXA: IV Allergies:  NKDA Anesthesia Concerns: None BMI: 31.9 Last HgbA1c: Not diabetic Pharmacy: [facility]  - Patient was instructed on what medications to stop prior to surgery. - Follow-up visit in 2 weeks with Dr. Lequita Halt - Begin physical therapy following surgery - Pre-operative lab work as pre-surgical testing - Prescriptions will be provided in hospital at time of discharge  Arther Abbott, PA-C Orthopedic Surgery [facility] [place]

## 2020-09-19 NOTE — Patient Instructions (Addendum)
DUE TO COVID-19 ONLY ONE VISITOR IS ALLOWED TO COME WITH YOU AND STAY IN THE WAITING ROOM ONLY DURING PRE OP AND PROCEDURE DAY OF SURGERY.   TWO VISITOR  MAY VISIT WITH YOU AFTER SURGERY IN YOUR PRIVATE ROOM DURING VISITING HOURS ONLY!  10am-8pm   YOU NEED TO HAVE A COVID 19 TEST ON___9-26-22____8am-3pm_______, THIS TEST MUST BE DONE BEFORE SURGERY,     Please bring completed form with you to the COVID testing site   COVID TESTING SITE [address] [place]   [phone]      ONCE YOUR COVID TEST IS COMPLETED,  PLEASE Wear a mask when in public           Your procedure is scheduled on: 10-08-20   [facility] Main  Entrance   Report to admitting at         1035 AM     Call this number if you have problems the morning of surgery [phone]   Remember: NO SOLID FOOD AFTER MIDNIGHT THE NIGHT PRIOR TO SURGERY. NOTHING BY MOUTH EXCEPT CLEAR LIQUIDS UNTIL    1020 am .   PLEASE FINISH ENSURE DRINK PER SURGEON ORDER  WHICH NEEDS TO BE COMPLETED AT     1020 am then nothing by mouth.       CLEAR LIQUID DIET   Foods Allowed                                                                               Foods Excluded Water Black Coffee and tea, regular and decaf                             liquids that you cannot  Plain Jell-O any favor except red or purple                                           see through such as: Fruit ices (not with fruit pulp)                                                        milk, soups, orange juice  Iced Popsicles                                                        All solid food Carbonated beverages, regular and diet                                    Cranberry, grape and apple juices Sports drinks like Gatorade Lightly seasoned clear broth or consume(fat free) Sugar   _____________________________________________________________________  BRUSH YOUR TEETH MORNING OF SURGERY AND RINSE YOUR MOUTH OUT, NO CHEWING  GUM CANDY OR MINTS.     Take these medicines the morning of surgery with A SIP OF WATER: Keppra, labetalol, clonidine, atorvastatin, amlodipine                                 You may not have any metal on your body including hair pins and              piercings  Do not wear jewelry, make-up, lotions, powders,perfumes,      deodorant             Do not wear nail polish on your fingernails or toenails .  Do not shave  48 hours prior to surgery.               Do not bring valuables to the hospital. [facility] IS NOT             RESPONSIBLE   FOR VALUABLES.  Contacts, dentures or bridgework may not be worn into surgery.       Patients discharged the day of surgery will not be allowed to drive home. IF YOU ARE HAVING SURGERY AND GOING HOME THE SAME DAY, YOU MUST HAVE AN ADULT TO DRIVE YOU HOME AND BE WITH YOU FOR 24 HOURS. YOU MAY GO HOME BY TAXI OR UBER OR ORTHERWISE, BUT AN ADULT MUST ACCOMPANY YOU HOME AND STAY WITH YOU FOR 24 HOURS.  Name and phone number of your driver:  Special Instructions: N/A              Please read over the following fact sheets you were given: _____________________________________________________________________             [facility] - Preparing for Surgery Before surgery, you can play an important role.  Because skin is not sterile, your skin needs to be as free of germs as possible.  You can reduce the number of germs on your skin by washing with CHG (chlorahexidine gluconate) soap before surgery.  CHG is an antiseptic cleaner which kills germs and bonds with the skin to continue killing germs even after washing. Please DO NOT use if you have an allergy to CHG or antibacterial soaps.  If your skin becomes reddened/irritated stop using the CHG and inform your nurse when you arrive at Short Stay. Do not shave (including legs and underarms) for at least 48 hours prior to the first CHG shower.  You may shave your face/neck. Please follow these instructions  carefully:  1.  Shower with CHG Soap the night before surgery and the  morning of Surgery.  2.  If you choose to wash your hair, wash your hair first as usual with your  normal  shampoo.  3.  After you shampoo, rinse your hair and body thoroughly to remove the  shampoo.                           4.  Use CHG as you would any other liquid soap.  You can apply chg directly  to the skin and wash                       Gently with a scrungie or clean washcloth.  5.  Apply the CHG Soap to your body ONLY FROM THE NECK  DOWN.   Do not use on face/ open                           Wound or open sores. Avoid contact with eyes, ears mouth and genitals (private parts).                       Wash face,  Genitals (private parts) with your normal soap.             6.  Wash thoroughly, paying special attention to the area where your surgery  will be performed.  7.  Thoroughly rinse your body with warm water from the neck down.  8.  DO NOT shower/wash with your normal soap after using and rinsing off  the CHG Soap.                9.  Pat yourself dry with a clean towel.            10.  Wear clean pajamas.            11.  Place clean sheets on your bed the night of your first shower and do not  sleep with pets. Day of Surgery : Do not apply any lotions/deodorants the morning of surgery.  Please wear clean clothes to the hospital/surgery center.  FAILURE TO FOLLOW THESE INSTRUCTIONS MAY RESULT IN THE CANCELLATION OF YOUR SURGERY PATIENT SIGNATURE_________________________________  NURSE SIGNATURE__________________________________  ________________________________________________________________________    Tracy Lawson  An incentive spirometer is a tool that can help keep your lungs clear and active. This tool measures how well you are filling your lungs with each breath. Taking long deep breaths may help reverse or decrease the chance of developing breathing (pulmonary) problems (especially infection)  following: A long period of time when you are unable to move or be active. BEFORE THE PROCEDURE  If the spirometer includes an indicator to show your best effort, your nurse or respiratory therapist will set it to a desired goal. If possible, sit up straight or lean slightly forward. Try not to slouch. Hold the incentive spirometer in an upright position. INSTRUCTIONS FOR USE  Sit on the edge of your bed if possible, or sit up as far as you can in bed or on a chair. Hold the incentive spirometer in an upright position. Breathe out normally. Place the mouthpiece in your mouth and seal your lips tightly around it. Breathe in slowly and as deeply as possible, raising the piston or the ball toward the top of the column. Hold your breath for 3-5 seconds or for as long as possible. Allow the piston or ball to fall to the bottom of the column. Remove the mouthpiece from your mouth and breathe out normally. Rest for a few seconds and repeat Steps 1 through 7 at least 10 times every 1-2 hours when you are awake. Take your time and take a few normal breaths between deep breaths. The spirometer may include an indicator to show your best effort. Use the indicator as a goal to work toward during each repetition. After each set of 10 deep breaths, practice coughing to be sure your lungs are clear. If you have an incision (the cut made at the time of surgery), support your incision when coughing by placing a pillow or rolled up towels firmly against it. Once you are able to get out of bed, walk around indoors and cough well. You may stop  using the incentive spirometer when instructed by your caregiver.  RISKS AND COMPLICATIONS Take your time so you do not get dizzy or light-headed. If you are in pain, you may need to take or ask for pain medication before doing incentive spirometry. It is harder to take a deep breath if you are having pain. AFTER USE Rest and breathe slowly and easily. It can be helpful to  keep track of a log of your progress. Your caregiver can provide you with a simple table to help with this. If you are using the spirometer at home, follow these instructions: SEEK MEDICAL CARE IF:  You are having difficultly using the spirometer. You have trouble using the spirometer as often as instructed. Your pain medication is not giving enough relief while using the spirometer. You develop fever of 100.5 F (38.1 C) or higher. SEEK IMMEDIATE MEDICAL CARE IF:  You cough up bloody sputum that had not been present before. You develop fever of 102 F (38.9 C) or greater. You develop worsening pain at or near the incision site. MAKE SURE YOU:  Understand these instructions. Will watch your condition. Will get help right away if you are not doing well or get worse. Document Released: 05/10/2006 Document Revised: 03/22/2011 Document Reviewed: 07/11/2006 [facility] Patient Information 2014 [place], [place].   ________________________________________________________________________

## 2020-09-25 ENCOUNTER — Other Ambulatory Visit: Payer: Self-pay

## 2020-09-25 ENCOUNTER — Encounter (HOSPITAL_COMMUNITY): Payer: Self-pay

## 2020-09-25 ENCOUNTER — Encounter (HOSPITAL_COMMUNITY)
Admission: RE | Admit: 2020-09-25 | Discharge: 2020-09-25 | Disposition: A | Discharge status: Home / Self Care | Payer: Medicare PPO | Source: Ambulatory Visit | Attending: Orthopedic Surgery | Admitting: Orthopedic Surgery

## 2020-09-25 DIAGNOSIS — Z01818 Encounter for other preprocedural examination: Secondary | ICD-10-CM | POA: Insufficient documentation

## 2020-09-25 HISTORY — DX: Unspecified osteoarthritis, unspecified site: M19.90

## 2020-09-25 LAB — CBC
HCT: 39.9 % (ref 36.0–46.0)
Hemoglobin: 13 g/dL (ref 12.0–15.0)
MCH: 29.7 pg (ref 26.0–34.0)
MCHC: 32.6 g/dL (ref 30.0–36.0)
MCV: 91.1 fL (ref 80.0–100.0)
Platelets: 218 10*3/uL (ref 150–400)
RBC: 4.38 MIL/uL (ref 3.87–5.11)
RDW: 12.8 % (ref 11.5–15.5)
WBC: 3.9 10*3/uL — ABNORMAL LOW (ref 4.0–10.5)
nRBC: 0 % (ref 0.0–0.2)

## 2020-09-25 LAB — TYPE AND SCREEN
ABO/RH(D): O POS
Antibody Screen: NEGATIVE

## 2020-09-25 LAB — COMPREHENSIVE METABOLIC PANEL
ALT: 18 U/L (ref 0–44)
AST: 24 U/L (ref 15–41)
Albumin: 3.9 g/dL (ref 3.5–5.0)
Alkaline Phosphatase: 105 U/L (ref 38–126)
Anion gap: 5 (ref 5–15)
BUN: 19 mg/dL (ref 8–23)
CO2: 28 mmol/L (ref 22–32)
Calcium: 8.9 mg/dL (ref 8.9–10.3)
Chloride: 104 mmol/L (ref 98–111)
Creatinine, Ser: 0.76 mg/dL (ref 0.44–1.00)
GFR, Estimated: >60 mL/min (ref >60)
Glucose, Bld: 120 mg/dL — ABNORMAL HIGH (ref 70–99)
Potassium: 3.7 mmol/L (ref 3.5–5.1)
Sodium: 137 mmol/L (ref 135–145)
Total Bilirubin: 0.6 mg/dL (ref 0.3–1.2)
Total Protein: 7.8 g/dL (ref 6.5–8.1)

## 2020-09-25 LAB — SURGICAL PCR SCREEN
MRSA, PCR: NEGATIVE
Staphylococcus aureus: POSITIVE — AB

## 2020-09-25 LAB — PROTIME-INR
INR: 1.1 (ref 0.8–1.2)
Prothrombin Time: 13.9 seconds (ref 11.4–15.2)

## 2020-09-25 NOTE — Progress Notes (Addendum)
PCP - Dr.  Kirstie Peri MD Cardiologist - no  PPM/ICD -  Device Orders -  Rep Notified -   Chest x-ray -  EKG -  Stress Test -  ECHO -  Cardiac Cath -   Sleep Study -  CPAP -   Fasting Blood Sugar -  Checks Blood Sugar _____ times a day  Blood Thinner Instructions: Aspirin Instructions: 81mg  stop 7 days prior  ERAS Protcol - PRE-SURGERY Ensure or G2-   COVID TEST-  COVID vaccine -Moderna x 2 and booster  Activity-- Able to walk a flight of stairs without SOB Anesthesia review: HTN, BP elevated at preop 228/105, 222/95, right arm, Left arm 205/101 , PA-C aware at preop pt. Aware if BP elevated DOS she could be cancelled . Pt. States she has an appt. With PCP next week for check up and she will try to go sooner. Also stated her BP at home this morning was 165/85 and she checks her BP every morning.  Patient denies shortness of breath, fever, cough and chest pain at PAT appointment   All instructions explained to the patient, with a verbal understanding of the material. Patient agrees to go over the instructions while at home for a better understanding. Patient also instructed to self quarantine after being tested for COVID-19. The opportunity to ask questions was provided.

## 2020-09-30 ENCOUNTER — Other Ambulatory Visit: Payer: Self-pay

## 2020-09-30 ENCOUNTER — Ambulatory Visit (INDEPENDENT_AMBULATORY_CARE_PROVIDER_SITE_OTHER): Payer: Medicare PPO | Admitting: Podiatry

## 2020-09-30 DIAGNOSIS — M79671 Pain in right foot: Secondary | ICD-10-CM

## 2020-09-30 DIAGNOSIS — M7661 Achilles tendinitis, right leg: Secondary | ICD-10-CM | POA: Diagnosis not present

## 2020-09-30 NOTE — Progress Notes (Signed)
  Subjective:  Patient ID: Tracy Lawson, female    DOB: 16-Sep-1950,  MRN: [id number]  Chief Complaint  Patient presents with   Tendonitis    Follow up Achilles right    70 y.o. female presents with the above complaint. History confirmed with patient.  States the pain is doing better but she is still having pain.  Had to stop meloxicam due to increased blood pressure.  She is wearing the ankle brace and thinks that helps.  Objective:  Physical Exam: warm, good capillary refill, no trophic changes or ulcerative lesions, normal DP and PT pulses, and normal sensory exam.  Right Foot: tenderness to palpation posterior calcaneus, no pain with calcaneal squeeze, decreased ankle joint ROM, and +Silverskiold test. Local warmth and edema of posterior Achilles  Assessment:   1. Tendonitis, Achilles, right   2. Pain in right foot    Plan:  Patient was evaluated and treated and all questions answered.  Achilles Tendonitis  -Still with pain and likely inflammation.  She had to stop the meloxicam due to increased Blood pressure.  As she is having hip surgery next week avoid other anti-inflammatories such as NSAIDs.  She could consider Voltaren gel if allowed by her orthopedic surgeon.  Continue ankle brace and follow-up in 6 weeks for further discussion  No follow-ups on file.

## 2020-10-02 DIAGNOSIS — Z0000 Encounter for general adult medical examination without abnormal findings: Secondary | ICD-10-CM | POA: Diagnosis not present

## 2020-10-02 DIAGNOSIS — E7800 Pure hypercholesterolemia, unspecified: Secondary | ICD-10-CM | POA: Diagnosis not present

## 2020-10-02 DIAGNOSIS — Z79899 Other long term (current) drug therapy: Secondary | ICD-10-CM | POA: Diagnosis not present

## 2020-10-02 DIAGNOSIS — Z1339 Encounter for screening examination for other mental health and behavioral disorders: Secondary | ICD-10-CM | POA: Diagnosis not present

## 2020-10-02 DIAGNOSIS — Z299 Encounter for prophylactic measures, unspecified: Secondary | ICD-10-CM | POA: Diagnosis not present

## 2020-10-02 DIAGNOSIS — Z6832 Body mass index (BMI) 32.0-32.9, adult: Secondary | ICD-10-CM | POA: Diagnosis not present

## 2020-10-02 DIAGNOSIS — R5383 Other fatigue: Secondary | ICD-10-CM | POA: Diagnosis not present

## 2020-10-02 DIAGNOSIS — Z7189 Other specified counseling: Secondary | ICD-10-CM | POA: Diagnosis not present

## 2020-10-02 DIAGNOSIS — E559 Vitamin D deficiency, unspecified: Secondary | ICD-10-CM | POA: Diagnosis not present

## 2020-10-02 DIAGNOSIS — Z1331 Encounter for screening for depression: Secondary | ICD-10-CM | POA: Diagnosis not present

## 2020-10-02 DIAGNOSIS — M7750 Other enthesopathy of unspecified foot: Secondary | ICD-10-CM | POA: Diagnosis not present

## 2020-10-06 ENCOUNTER — Other Ambulatory Visit: Payer: Self-pay | Admitting: Orthopedic Surgery

## 2020-10-07 LAB — SARS CORONAVIRUS 2 (TAT 6-24 HRS): SARS Coronavirus 2: NEGATIVE

## 2020-10-07 NOTE — Anesthesia Preprocedure Evaluation (Addendum)
Anesthesia Evaluation  Patient identified by MRN, date of birth, ID band Patient awake    Reviewed: Allergy & Precautions, NPO status , Patient's Chart, lab work & pertinent test results, reviewed documented beta blocker date and time   History of Anesthesia Complications Negative for: history of anesthetic complications  Airway Mallampati: II  TM Distance: >3 FB     Dental no notable dental hx. (+) Dental Advisory Given   Pulmonary neg pulmonary ROS, former smoker,    Pulmonary exam normal        Cardiovascular hypertension, Pt. on medications and Pt. on home beta blockers Normal cardiovascular exam     Neuro/Psych Seizures -,     GI/Hepatic negative GI ROS, Neg liver ROS,   Endo/Other  negative endocrine ROS  Renal/GU negative Renal ROS     Musculoskeletal  (+) Arthritis ,   Abdominal   Peds  Hematology negative hematology ROS (+)   Anesthesia Other Findings   Reproductive/Obstetrics                            Anesthesia Physical Anesthesia Plan  ASA: 2  Anesthesia Plan: Spinal   Post-op Pain Management:    Induction:   PONV Risk Score and Plan: 2 and Ondansetron and Propofol infusion  Airway Management Planned: Natural Airway  Additional Equipment:   Intra-op Plan:   Post-operative Plan:   Informed Consent: I have reviewed the patients History and Physical, chart, labs and discussed the procedure including the risks, benefits and alternatives for the proposed anesthesia with the patient or authorized representative who has indicated his/her understanding and acceptance.     Dental advisory given  Plan Discussed with: Anesthesiologist  Anesthesia Plan Comments:        Anesthesia Quick Evaluation

## 2020-10-08 ENCOUNTER — Observation Stay (HOSPITAL_COMMUNITY): Payer: Medicare PPO

## 2020-10-08 ENCOUNTER — Ambulatory Visit (HOSPITAL_COMMUNITY): Payer: Medicare PPO | Admitting: Physician Assistant

## 2020-10-08 ENCOUNTER — Ambulatory Visit (HOSPITAL_COMMUNITY): Payer: Medicare PPO

## 2020-10-08 ENCOUNTER — Encounter (HOSPITAL_COMMUNITY)
Admission: RE | Disposition: A | Discharge status: Home / Self Care | Payer: Self-pay | Source: Home / Self Care | Attending: Orthopedic Surgery

## 2020-10-08 ENCOUNTER — Encounter (HOSPITAL_COMMUNITY): Payer: Self-pay | Admitting: Orthopedic Surgery

## 2020-10-08 ENCOUNTER — Ambulatory Visit (HOSPITAL_COMMUNITY): Payer: Medicare PPO | Admitting: Anesthesiology

## 2020-10-08 ENCOUNTER — Observation Stay (HOSPITAL_COMMUNITY)
Admission: RE | Admit: 2020-10-08 | Discharge: 2020-10-09 | Disposition: A | Discharge status: Home / Self Care | Payer: Medicare PPO | Attending: Orthopedic Surgery | Admitting: Orthopedic Surgery

## 2020-10-08 DIAGNOSIS — I10 Essential (primary) hypertension: Secondary | ICD-10-CM | POA: Diagnosis not present

## 2020-10-08 DIAGNOSIS — Z419 Encounter for procedure for purposes other than remedying health state, unspecified: Secondary | ICD-10-CM

## 2020-10-08 DIAGNOSIS — M169 Osteoarthritis of hip, unspecified: Secondary | ICD-10-CM | POA: Diagnosis present

## 2020-10-08 DIAGNOSIS — Z96641 Presence of right artificial hip joint: Secondary | ICD-10-CM | POA: Diagnosis not present

## 2020-10-08 DIAGNOSIS — Z87891 Personal history of nicotine dependence: Secondary | ICD-10-CM | POA: Diagnosis not present

## 2020-10-08 DIAGNOSIS — Z79899 Other long term (current) drug therapy: Secondary | ICD-10-CM | POA: Diagnosis not present

## 2020-10-08 DIAGNOSIS — G40209 Localization-related (focal) (partial) symptomatic epilepsy and epileptic syndromes with complex partial seizures, not intractable, without status epilepticus: Secondary | ICD-10-CM | POA: Diagnosis not present

## 2020-10-08 DIAGNOSIS — Z96649 Presence of unspecified artificial hip joint: Secondary | ICD-10-CM

## 2020-10-08 DIAGNOSIS — Z471 Aftercare following joint replacement surgery: Secondary | ICD-10-CM | POA: Diagnosis not present

## 2020-10-08 DIAGNOSIS — M1611 Unilateral primary osteoarthritis, right hip: Secondary | ICD-10-CM | POA: Diagnosis not present

## 2020-10-08 HISTORY — PX: TOTAL HIP ARTHROPLASTY: SHX124

## 2020-10-08 LAB — ABO/RH: ABO/RH(D): O POS

## 2020-10-08 SURGERY — ARTHROPLASTY, HIP, TOTAL, ANTERIOR APPROACH
Anesthesia: Spinal | Site: Hip | Laterality: Right

## 2020-10-08 MED ORDER — ONDANSETRON HCL 4 MG PO TABS: 4.0000 mg | ORAL_TABLET | Freq: Four times a day (QID) | ORAL | Status: DC | PRN

## 2020-10-08 MED ORDER — ONDANSETRON HCL 4 MG/2ML IJ SOLN
INTRAMUSCULAR | Status: DC | PRN
  Administered 2020-10-08: 4 mg via INTRAVENOUS

## 2020-10-08 MED ORDER — METOCLOPRAMIDE HCL 5 MG/ML IJ SOLN: 5.0000 mg | Freq: Three times a day (TID) | INTRAMUSCULAR | Status: DC | PRN

## 2020-10-08 MED ORDER — HYDROCODONE-ACETAMINOPHEN 5-325 MG PO TABS
1.0000 | ORAL_TABLET | ORAL | Status: DC | PRN
  Administered 2020-10-08 – 2020-10-09 (×5): 2 via ORAL
  Filled 2020-10-08 (×6): qty 2

## 2020-10-08 MED ORDER — CEFAZOLIN SODIUM-DEXTROSE 2-4 GM/100ML-% IV SOLN
2.0000 g | INTRAVENOUS | Status: AC
  Administered 2020-10-08: 2 g via INTRAVENOUS
  Filled 2020-10-08: qty 100

## 2020-10-08 MED ORDER — MORPHINE SULFATE (PF) 2 MG/ML IV SOLN
0.5000 mg | INTRAVENOUS | Status: DC | PRN
  Administered 2020-10-08 – 2020-10-09 (×3): 1 mg via INTRAVENOUS
  Filled 2020-10-08 (×3): qty 1

## 2020-10-08 MED ORDER — FENTANYL CITRATE (PF) 100 MCG/2ML IJ SOLN
INTRAMUSCULAR | Status: AC
  Filled 2020-10-08: qty 2

## 2020-10-08 MED ORDER — DEXAMETHASONE SODIUM PHOSPHATE 10 MG/ML IJ SOLN
8.0000 mg | Freq: Once | INTRAMUSCULAR | Status: AC
  Administered 2020-10-08: 8 mg via INTRAVENOUS

## 2020-10-08 MED ORDER — PROMETHAZINE HCL 25 MG/ML IJ SOLN: 6.2500 mg | INTRAMUSCULAR | Status: DC | PRN

## 2020-10-08 MED ORDER — ONDANSETRON HCL 4 MG/2ML IJ SOLN
4.0000 mg | Freq: Four times a day (QID) | INTRAMUSCULAR | Status: DC | PRN
  Administered 2020-10-09: 4 mg via INTRAVENOUS
  Filled 2020-10-08: qty 2

## 2020-10-08 MED ORDER — CARBAMAZEPINE ER 200 MG PO TB12
300.0000 mg | ORAL_TABLET | Freq: Every day | ORAL | Status: DC
  Administered 2020-10-09: 300 mg via ORAL
  Filled 2020-10-08: qty 1

## 2020-10-08 MED ORDER — POLYETHYLENE GLYCOL 3350 17 G PO PACK: 17.0000 g | PACK | Freq: Every day | ORAL | Status: DC | PRN

## 2020-10-08 MED ORDER — SODIUM CHLORIDE 0.9 % IV SOLN: INTRAVENOUS | Status: DC

## 2020-10-08 MED ORDER — HYDRALAZINE HCL 20 MG/ML IJ SOLN
10.0000 mg | Freq: Three times a day (TID) | INTRAMUSCULAR | Status: DC | PRN
Start: 2020-10-08 — End: 2020-10-09
  Administered 2020-10-09: 10 mg via INTRAVENOUS
  Filled 2020-10-08: qty 1

## 2020-10-08 MED ORDER — WATER FOR IRRIGATION, STERILE IR SOLN
Status: DC | PRN
  Administered 2020-10-08: 2000 mL

## 2020-10-08 MED ORDER — ONDANSETRON HCL 4 MG/2ML IJ SOLN
INTRAMUSCULAR | Status: AC
  Filled 2020-10-08: qty 2

## 2020-10-08 MED ORDER — CLONIDINE HCL 0.1 MG PO TABS: 0.3000 mg | ORAL_TABLET | Freq: Two times a day (BID) | ORAL | Status: DC

## 2020-10-08 MED ORDER — BUPIVACAINE IN DEXTROSE 0.75-8.25 % IT SOLN
INTRATHECAL | Status: DC | PRN
  Administered 2020-10-08: 1.8 mL via INTRATHECAL

## 2020-10-08 MED ORDER — CLONIDINE HCL 0.1 MG PO TABS
0.3000 mg | ORAL_TABLET | Freq: Two times a day (BID) | ORAL | Status: DC
  Administered 2020-10-08 – 2020-10-09 (×2): 0.3 mg via ORAL
  Filled 2020-10-08 (×3): qty 3

## 2020-10-08 MED ORDER — MIDAZOLAM HCL 5 MG/5ML IJ SOLN
INTRAMUSCULAR | Status: DC | PRN
  Administered 2020-10-08: 2 mg via INTRAVENOUS

## 2020-10-08 MED ORDER — ASPIRIN EC 325 MG PO TBEC
325.0000 mg | DELAYED_RELEASE_TABLET | Freq: Two times a day (BID) | ORAL | Status: DC
  Administered 2020-10-09: 325 mg via ORAL
  Filled 2020-10-08: qty 1

## 2020-10-08 MED ORDER — DOCUSATE SODIUM 100 MG PO CAPS
100.0000 mg | ORAL_CAPSULE | Freq: Two times a day (BID) | ORAL | Status: DC
  Administered 2020-10-08 – 2020-10-09 (×2): 100 mg via ORAL
  Filled 2020-10-08 (×2): qty 1

## 2020-10-08 MED ORDER — LACTATED RINGERS IV SOLN: INTRAVENOUS | Status: DC

## 2020-10-08 MED ORDER — POVIDONE-IODINE 10 % EX SWAB
2.0000 "application " | Freq: Once | CUTANEOUS | Status: AC
  Administered 2020-10-08: 2 via TOPICAL

## 2020-10-08 MED ORDER — DEXAMETHASONE SODIUM PHOSPHATE 10 MG/ML IJ SOLN
10.0000 mg | Freq: Once | INTRAMUSCULAR | Status: AC
  Administered 2020-10-09: 10 mg via INTRAVENOUS
  Filled 2020-10-08: qty 1

## 2020-10-08 MED ORDER — PHENYLEPHRINE HCL (PRESSORS) 10 MG/ML IV SOLN
INTRAVENOUS | Status: AC
  Filled 2020-10-08: qty 2

## 2020-10-08 MED ORDER — EPHEDRINE SULFATE 50 MG/ML IJ SOLN
INTRAMUSCULAR | Status: DC | PRN
  Administered 2020-10-08: 5 mg via INTRAVENOUS
  Administered 2020-10-08 (×2): 10 mg via INTRAVENOUS

## 2020-10-08 MED ORDER — CHLORHEXIDINE GLUCONATE 0.12 % MT SOLN
15.0000 mL | Freq: Once | OROMUCOSAL | Status: AC
  Administered 2020-10-08: 15 mL via OROMUCOSAL

## 2020-10-08 MED ORDER — MENTHOL 3 MG MT LOZG: 1.0000 | LOZENGE | OROMUCOSAL | Status: DC | PRN

## 2020-10-08 MED ORDER — DEXAMETHASONE SODIUM PHOSPHATE 10 MG/ML IJ SOLN
INTRAMUSCULAR | Status: AC
  Filled 2020-10-08: qty 1

## 2020-10-08 MED ORDER — METHOCARBAMOL 500 MG PO TABS
500.0000 mg | ORAL_TABLET | Freq: Four times a day (QID) | ORAL | Status: DC | PRN
  Administered 2020-10-08 – 2020-10-09 (×4): 500 mg via ORAL
  Filled 2020-10-08 (×4): qty 1

## 2020-10-08 MED ORDER — TRANEXAMIC ACID-NACL 1000-0.7 MG/100ML-% IV SOLN
1000.0000 mg | INTRAVENOUS | Status: AC
  Administered 2020-10-08: 1000 mg via INTRAVENOUS
  Filled 2020-10-08: qty 100

## 2020-10-08 MED ORDER — PHENOL 1.4 % MT LIQD: 1.0000 | OROMUCOSAL | Status: DC | PRN

## 2020-10-08 MED ORDER — PROPOFOL 1000 MG/100ML IV EMUL
INTRAVENOUS | Status: AC
  Filled 2020-10-08: qty 100

## 2020-10-08 MED ORDER — FENTANYL CITRATE PF 50 MCG/ML IJ SOSY: 25.0000 ug | PREFILLED_SYRINGE | INTRAMUSCULAR | Status: DC | PRN

## 2020-10-08 MED ORDER — PROPOFOL 10 MG/ML IV BOLUS
INTRAVENOUS | Status: AC
  Filled 2020-10-08: qty 20

## 2020-10-08 MED ORDER — BUPIVACAINE HCL 0.25 % IJ SOLN
INTRAMUSCULAR | Status: AC
  Filled 2020-10-08: qty 1

## 2020-10-08 MED ORDER — FUROSEMIDE 40 MG PO TABS
40.0000 mg | ORAL_TABLET | Freq: Two times a day (BID) | ORAL | Status: DC
  Administered 2020-10-08 – 2020-10-09 (×2): 40 mg via ORAL
  Filled 2020-10-08 (×2): qty 1

## 2020-10-08 MED ORDER — ACETAMINOPHEN 10 MG/ML IV SOLN
1000.0000 mg | Freq: Four times a day (QID) | INTRAVENOUS | Status: DC
Start: 2020-10-08 — End: 2020-10-08
  Administered 2020-10-08: 1000 mg via INTRAVENOUS
  Filled 2020-10-08: qty 100

## 2020-10-08 MED ORDER — GLYCOPYRROLATE 0.2 MG/ML IJ SOLN
INTRAMUSCULAR | Status: DC | PRN
  Administered 2020-10-08: .2 mg via INTRAVENOUS

## 2020-10-08 MED ORDER — BISACODYL 10 MG RE SUPP: 10.0000 mg | Freq: Every day | RECTAL | Status: DC | PRN

## 2020-10-08 MED ORDER — CARBAMAZEPINE ER 300 MG PO CP12: 300.0000 mg | ORAL_CAPSULE | Freq: Two times a day (BID) | ORAL | Status: DC

## 2020-10-08 MED ORDER — CHLORHEXIDINE GLUCONATE CLOTH 2 % EX PADS: 6.0000 | MEDICATED_PAD | Freq: Every day | CUTANEOUS | Status: DC

## 2020-10-08 MED ORDER — ATORVASTATIN CALCIUM 20 MG PO TABS
20.0000 mg | ORAL_TABLET | Freq: Every day | ORAL | Status: DC
  Administered 2020-10-09: 20 mg via ORAL
  Filled 2020-10-08: qty 1

## 2020-10-08 MED ORDER — LABETALOL HCL 100 MG PO TABS
300.0000 mg | ORAL_TABLET | Freq: Two times a day (BID) | ORAL | Status: DC
  Administered 2020-10-08 – 2020-10-09 (×2): 300 mg via ORAL
  Filled 2020-10-08 (×2): qty 3

## 2020-10-08 MED ORDER — EPHEDRINE 5 MG/ML INJ
INTRAVENOUS | Status: AC
  Filled 2020-10-08: qty 5

## 2020-10-08 MED ORDER — AMLODIPINE BESYLATE 10 MG PO TABS
10.0000 mg | ORAL_TABLET | Freq: Every day | ORAL | Status: DC
  Administered 2020-10-09: 10 mg via ORAL
  Filled 2020-10-08: qty 1

## 2020-10-08 MED ORDER — LEVETIRACETAM 500 MG PO TABS
750.0000 mg | ORAL_TABLET | Freq: Two times a day (BID) | ORAL | Status: DC
  Administered 2020-10-08 – 2020-10-09 (×2): 750 mg via ORAL
  Filled 2020-10-08 (×2): qty 1

## 2020-10-08 MED ORDER — GLYCOPYRROLATE 0.2 MG/ML IJ SOLN
INTRAMUSCULAR | Status: AC
  Filled 2020-10-08: qty 1

## 2020-10-08 MED ORDER — METHOCARBAMOL 500 MG IVPB - SIMPLE MED
500.0000 mg | Freq: Four times a day (QID) | INTRAVENOUS | Status: DC | PRN
  Filled 2020-10-08: qty 50

## 2020-10-08 MED ORDER — TRAMADOL HCL 50 MG PO TABS
50.0000 mg | ORAL_TABLET | Freq: Four times a day (QID) | ORAL | Status: DC | PRN
  Administered 2020-10-08: 50 mg via ORAL
  Administered 2020-10-08 – 2020-10-09 (×2): 100 mg via ORAL
  Filled 2020-10-08: qty 1
  Filled 2020-10-08 (×2): qty 2

## 2020-10-08 MED ORDER — 0.9 % SODIUM CHLORIDE (POUR BTL) OPTIME
TOPICAL | Status: DC | PRN
  Administered 2020-10-08: 1000 mL

## 2020-10-08 MED ORDER — MIDAZOLAM HCL 2 MG/2ML IJ SOLN
INTRAMUSCULAR | Status: AC
  Filled 2020-10-08: qty 2

## 2020-10-08 MED ORDER — METOCLOPRAMIDE HCL 5 MG PO TABS: 5.0000 mg | ORAL_TABLET | Freq: Three times a day (TID) | ORAL | Status: DC | PRN

## 2020-10-08 MED ORDER — POTASSIUM CHLORIDE CRYS ER 20 MEQ PO TBCR
20.0000 meq | EXTENDED_RELEASE_TABLET | Freq: Every day | ORAL | Status: DC
  Administered 2020-10-09: 20 meq via ORAL
  Filled 2020-10-08: qty 1

## 2020-10-08 MED ORDER — BUPIVACAINE HCL 0.25 % IJ SOLN
INTRAMUSCULAR | Status: DC | PRN
  Administered 2020-10-08: 30 mL

## 2020-10-08 MED ORDER — CARBAMAZEPINE ER 200 MG PO TB12
600.0000 mg | ORAL_TABLET | Freq: Every day | ORAL | Status: DC
  Administered 2020-10-08: 600 mg via ORAL
  Filled 2020-10-08: qty 3

## 2020-10-08 MED ORDER — PROPOFOL 10 MG/ML IV BOLUS
INTRAVENOUS | Status: DC | PRN
  Administered 2020-10-08: 30 mg via INTRAVENOUS

## 2020-10-08 MED ORDER — AMISULPRIDE (ANTIEMETIC) 5 MG/2ML IV SOLN: 10.0000 mg | Freq: Once | INTRAVENOUS | Status: DC | PRN

## 2020-10-08 MED ORDER — ACETAMINOPHEN 325 MG PO TABS: 325.0000 mg | ORAL_TABLET | Freq: Four times a day (QID) | ORAL | Status: DC | PRN

## 2020-10-08 MED ORDER — FUROSEMIDE 40 MG PO TABS: 40.0000 mg | ORAL_TABLET | Freq: Two times a day (BID) | ORAL | Status: DC

## 2020-10-08 MED ORDER — ORAL CARE MOUTH RINSE: 15.0000 mL | Freq: Once | OROMUCOSAL | Status: AC

## 2020-10-08 MED ORDER — PROPOFOL 500 MG/50ML IV EMUL
INTRAVENOUS | Status: DC | PRN
  Administered 2020-10-08: 75 ug/kg/min via INTRAVENOUS

## 2020-10-08 MED ORDER — CEFAZOLIN SODIUM-DEXTROSE 2-4 GM/100ML-% IV SOLN
2.0000 g | Freq: Four times a day (QID) | INTRAVENOUS | Status: AC
Start: 2020-10-08 — End: 2020-10-08
  Administered 2020-10-08 (×2): 2 g via INTRAVENOUS
  Filled 2020-10-08 (×2): qty 100

## 2020-10-08 SURGICAL SUPPLY — 43 items
BAG COUNTER SPONGE SURGICOUNT (BAG) ×2 IMPLANT
BAG DECANTER FOR FLEXI CONT (MISCELLANEOUS) IMPLANT
BAG ZIPLOCK 12X15 (MISCELLANEOUS) IMPLANT
BLADE SAG 18X100X1.27 (BLADE) ×2 IMPLANT
COVER PERINEAL POST (MISCELLANEOUS) ×2 IMPLANT
COVER SURGICAL LIGHT HANDLE (MISCELLANEOUS) ×2 IMPLANT
CUP ACETBLR 48 OD SECTOR II (Hips) ×2 IMPLANT
DECANTER SPIKE VIAL GLASS SM (MISCELLANEOUS) ×2 IMPLANT
DRAPE FOOT SWITCH (DRAPES) ×2 IMPLANT
DRAPE STERI IOBAN 125X83 (DRAPES) ×2 IMPLANT
DRAPE U-SHAPE 47X51 STRL (DRAPES) ×4 IMPLANT
DRSG AQUACEL AG ADV 3.5X10 (GAUZE/BANDAGES/DRESSINGS) ×2 IMPLANT
DURAPREP 26ML APPLICATOR (WOUND CARE) ×2 IMPLANT
ELECT REM PT RETURN 15FT ADLT (MISCELLANEOUS) ×2 IMPLANT
GLOVE SRG 8 PF TXTR STRL LF DI (GLOVE) ×1 IMPLANT
GLOVE SURG ENC MOIS LTX SZ6.5 (GLOVE) ×2 IMPLANT
GLOVE SURG ENC MOIS LTX SZ7 (GLOVE) ×2 IMPLANT
GLOVE SURG ENC MOIS LTX SZ8 (GLOVE) ×4 IMPLANT
GLOVE SURG UNDER POLY LF SZ7 (GLOVE) ×2 IMPLANT
GLOVE SURG UNDER POLY LF SZ8 (GLOVE) ×2
GLOVE SURG UNDER POLY LF SZ8.5 (GLOVE) IMPLANT
GOWN STRL REUS W/TWL LRG LVL3 (GOWN DISPOSABLE) ×4 IMPLANT
GOWN STRL REUS W/TWL XL LVL3 (GOWN DISPOSABLE) IMPLANT
HEAD CERAMIC DELTA 28M 12/14P5 (Head) ×2 IMPLANT
HOLDER FOLEY CATH W/STRAP (MISCELLANEOUS) ×2 IMPLANT
KIT TURNOVER KIT A (KITS) ×2 IMPLANT
LINER ACET ALTRX NEUT +4X28X48 (Hips) ×2 IMPLANT
MANIFOLD NEPTUNE II (INSTRUMENTS) ×2 IMPLANT
PACK ANTERIOR HIP CUSTOM (KITS) ×2 IMPLANT
PENCIL SMOKE EVACUATOR COATED (MISCELLANEOUS) ×2 IMPLANT
SPONGE T-LAP 18X18 ~~LOC~~+RFID (SPONGE) ×4 IMPLANT
STEM FEMORAL SZ 5MM STD ACTIS (Stem) ×2 IMPLANT
STRIP CLOSURE SKIN 1/2X4 (GAUZE/BANDAGES/DRESSINGS) ×4 IMPLANT
SUT ETHIBOND NAB CT1 #1 30IN (SUTURE) ×2 IMPLANT
SUT MNCRL AB 4-0 PS2 18 (SUTURE) ×2 IMPLANT
SUT STRATAFIX 0 PDS 27 VIOLET (SUTURE) ×2
SUT VIC AB 0 CT1 36 (SUTURE) ×4 IMPLANT
SUT VIC AB 2-0 CT1 27 (SUTURE) ×6
SUT VIC AB 2-0 CT1 TAPERPNT 27 (SUTURE) ×3 IMPLANT
SUTURE STRATFX 0 PDS 27 VIOLET (SUTURE) ×1 IMPLANT
SYR 50ML LL SCALE MARK (SYRINGE) IMPLANT
TRAY FOLEY MTR SLVR 14FR STAT (SET/KITS/TRAYS/PACK) ×2 IMPLANT
TUBE SUCTION HIGH CAP CLEAR NV (SUCTIONS) ×2 IMPLANT

## 2020-10-08 NOTE — Evaluation (Signed)
Physical Therapy Evaluation Patient Details Name: Tracy Lawson MRN: [id number] DOB: 1950/07/06 Today's Date: 10/08/2020  History of Present Illness  s/p R DA THA. PMH: HTN, complex partial Szs  Clinical Impression  Pt is s/p THA resulting in the deficits listed below (see PT Problem List).  Pt agreeable to OOB however limited to stand pivot transfer d/t hip pain. Pt was premedicated prior to PT session, RN is aware  Pt will benefit from skilled PT to increase their independence and safety with mobility to allow discharge to the venue listed below.         Recommendations for follow up therapy are one component of a multi-disciplinary discharge planning process, led by the attending physician.  Recommendations may be updated based on patient status, additional functional criteria and insurance authorization.  Follow Up Recommendations Follow surgeon's recommendation for DC plan and follow-up therapies    Equipment Recommendations  None recommended by PT    Recommendations for Other Services       Precautions / Restrictions Precautions Precautions: Fall Restrictions Weight Bearing Restrictions: No Other Position/Activity Restrictions: WBAT      Mobility  Bed Mobility Overal bed mobility: Needs Assistance Bed Mobility: Supine to Sit     Supine to sit: Min assist     General bed mobility comments: assist with RLE    Transfers Overall transfer level: Needs assistance Equipment used: Rolling walker (2 wheeled) Transfers: Sit to/from [employer] Sit to Stand: Min assist Stand pivot transfers: Min assist       General transfer comment: cues for hand placement and RLE position; assist to steady for stand pivot, verbal cues for sequence and RW position  Ambulation/Gait             General Gait Details: pivotal steps bed to recliner, limited by pain  Stairs            Wheelchair Mobility    Modified Rankin (Stroke Patients Only)        Balance                                             Pertinent Vitals/Pain Pain Assessment: 0-10 Pain Score: 9  Pain Location: right hip Pain Descriptors / Indicators: Grimacing;Sore Pain Intervention(s): Limited activity within patient's tolerance;Monitored during session;Premedicated before session;Repositioned;Ice applied    Home Living Family/patient expects to be discharged to:: Private residence Living Arrangements: Spouse/significant other   Type of Home: House Home Access: Ramped entrance     Home Layout: One level Home Equipment: Environmental consultant - 2 wheels      Prior Function Level of Independence: Independent               Hand Dominance        Extremity/Trunk Assessment   Upper Extremity Assessment Upper Extremity Assessment: Overall WFL for tasks assessed    Lower Extremity Assessment Lower Extremity Assessment: RLE deficits/detail RLE Deficits / Details: ankle grossly WFL hip and knee limited by pain RLE: Unable to fully assess due to pain       Communication   Communication: No difficulties  Cognition Arousal/Alertness: Awake/alert Behavior During Therapy: WFL for tasks assessed/performed Overall Cognitive Status: Within Functional Limits for tasks assessed  General Comments      Exercises Total Joint Exercises Ankle Circles/Pumps: AROM;Both;5 reps   Assessment/Plan    PT Assessment Patient needs continued PT services  PT Problem List Decreased strength;Decreased mobility;Decreased activity tolerance;Decreased knowledge of use of DME;Pain;Decreased range of motion       PT Treatment Interventions DME instruction;Therapeutic activities;Gait training;Functional mobility training;Therapeutic exercise;Patient/family education    PT Goals (Current goals can be found in the Care Plan section)  Acute Rehab PT Goals Patient Stated Goal: have less pain with  walking PT Goal Formulation: With patient Time For Goal Achievement: 10/15/20 Potential to Achieve Goals: Good    Frequency 7X/week   Barriers to discharge        Co-evaluation               AM-PAC PT "6 Clicks" Mobility  Outcome Measure Help needed turning from your back to your side while in a flat bed without using bedrails?: A Little Help needed moving from lying on your back to sitting on the side of a flat bed without using bedrails?: A Little Help needed moving to and from a bed to a chair (including a wheelchair)?: A Little Help needed standing up from a chair using your arms (e.g., wheelchair or bedside chair)?: A Little Help needed to walk in hospital room?: A Lot Help needed climbing 3-5 steps with a railing? : A Lot 6 Click Score: 16    End of Session Equipment Utilized During Treatment: Gait belt Activity Tolerance: Patient tolerated treatment well Patient left: in chair;with call bell/phone within reach;with chair alarm set Nurse Communication: Mobility status PT Visit Diagnosis: Other abnormalities of gait and mobility (R26.89);Difficulty in walking, not elsewhere classified (R26.2)    Time: 7564-3329 PT Time Calculation (min) (ACUTE ONLY): 15 min   Charges:   PT Evaluation $PT Eval Low Complexity: 1 Low          Erisha Paugh, PT  Acute Rehab Dept ([facility]) [phone] Pager [phone]  10/08/2020   [facility] 10/08/2020, 3:42 PM

## 2020-10-08 NOTE — Plan of Care (Signed)

## 2020-10-08 NOTE — Consult Note (Signed)
Medical Consultation  Tracy Lawson BSW:[id number] DOB: May 05, 1950 DOA: 10/08/2020 PCP: Kirstie Peri, MD   Requesting physician: Dr. Lequita Halt Date of consultation: 10/08/20 Reason for consultation: HTN  Impression/Recommendations HTN     - admitted to ortho, no s/p THR     - rpt BP this PM is 123/98     - continue home regimen (start back tonight) and add PRN hydralazine     - follow BP through night, let's see how her BP looks w/ all her meds back onboard (she held a couple for her surgery); if not improved, can consider echo in AM  Remainder per primary team  TRH will follow-up again tomorrow. Please contact me if I can be of assistance in the meanwhile. Thank you for this consultation.  Chief Complaint: Right hip pain  HPI:  Tracy Lawson is a 70 y.o. female with medical history significant of HTN, seizures. Presenting with hip pain. Has been following with orto. Recommendation was made for total hip arthroplasty after conservative management failed. She successfully completed right THR 10/08/20. Prior to surgery, it was noted that her BP was significantly elevated. Medications were given to control her BP and it improved ( SBP 212 -> 166). After her procedure, it was noted that her SBP climbed back up into the 190s. Her home medications were ordered. TRH was consulted for BP management. Of note, patient reports that she did not take all of her BP meds this morning. She denies CP, dyspnea, HA.   Review of Systems:  ROS is negative for all not mentioned in HPI.  Past Medical History:  Diagnosis Date   Arthritis    Localization-related (focal) (partial) epilepsy and epileptic syndromes with complex partial seizures, without mention of intractable epilepsy    Pain in limb    Seizures (HCC)    Sleep disturbance, unspecified    Unspecified essential hypertension    Past Surgical History:  Procedure Laterality Date   CESAREAN SECTION     x1   NO PAST SURGERIES     Social  History:  reports that she quit smoking about 28 years ago. Her smoking use included cigarettes. She has never used smokeless tobacco. She reports that she does not drink alcohol and does not use drugs.  No Known Allergies Family History  Problem Relation Age of Onset   Stroke Father    Heart failure Mother    Stroke Sister     Prior to Admission medications   Medication Sig Start Date End Date Taking? Authorizing Provider  amLODipine (NORVASC) 10 MG tablet Take 10 mg by mouth daily.   Yes [provider]  aspirin 81 MG tablet Take 81 mg by mouth daily.   Yes [provider]  atorvastatin (LIPITOR) 20 MG tablet Take 20 mg by mouth daily.   Yes [provider]  Calcium Carbonate-Vitamin D3 600-400 MG-UNIT TABS Take 1 tablet by mouth daily.   Yes [provider]  carbamazepine (CARBATROL) 300 MG 12 hr capsule TAKE 1 CAPSULE BY MOUTH EVERY MORNING AND TAKE TWO CAPSULES BY MOUTH AT BEDTIME 06/23/20  Yes [place], Aundra Millet, NP  cloNIDine (CATAPRES) 0.3 MG tablet Take 0.3 mg by mouth 2 (two) times daily.   Yes [provider]  Cod Liver Oil 1000 MG CAPS Take 1,000 mg by mouth daily.   Yes [provider]  furosemide (LASIX) 40 MG tablet Take 40 mg by mouth 2 (two) times daily.    Yes [provider]  labetalol (  NORMODYNE) 300 MG tablet Take 300 mg by mouth 2 (two) times daily. 02/05/20  Yes [provider]  levETIRAcetam (KEPPRA) 750 MG tablet TAKE 1 TABLET BY MOUTH EVERY TWELVE HOURS Patient taking differently: Take 750 mg by mouth 2 (two) times daily. 06/23/20  Yes Butch Penny, NP  meloxicam (MOBIC) 15 MG tablet Take 1 tablet (15 mg total) by mouth daily. 09/09/20  Yes Park Liter, DPM  Omega-3 Fatty Acids (OMEGA 3 500) 500 MG CAPS Take 1 capsule by mouth daily.   Yes [provider]  potassium chloride SA (K-DUR,KLOR-CON) 20 MEQ tablet Take 20 mEq by mouth daily.   Yes [provider]  Vitamin D,  Ergocalciferol, (DRISDOL) 1.25 MG (50000 UNIT) CAPS capsule Take 50,000 Units by mouth every Monday.   Yes [provider]  zinc gluconate 50 MG tablet Take 50 mg by mouth 2 (two) times a week.   Yes [provider]   Physical Exam: Blood pressure (!) 190/80, pulse 68, temperature 98 F (36.7 C), temperature source Oral, resp. rate 16, height 5' 5.5" (1.664 m), weight 87.1 kg, SpO2 99 %. Vitals:   10/08/20 1300 10/08/20 1420  BP: (!) 199/89 (!) 190/80  Pulse: 66 68  Resp: 16   Temp: 98 F (36.7 C)   SpO2: 99%     General: 70 y.o. female resting in bed in NAD Eyes: PERRL, normal sclera ENMT: Nares patent w/o discharge, orophaynx clear, dentition normal, ears w/o discharge/lesions/ulcers Neck: Supple, trachea midline Cardiovascular: RRR, +S1, S2, no m/g/r, equal pulses throughout Respiratory: CTABL, no w/r/r, normal WOB GI: BS+, NDNT, no masses noted, no organomegaly noted MSK: No e/c/c; ROM right hip limited d/t pain Skin: No rashes, bruises, ulcerations noted Neuro: A&O x 3, no focal deficits Psyc: Appropriate interaction and affect, calm/cooperative  Labs on Admission:  Basic Metabolic Panel: No results for input(s): NA, K, CL, CO2, GLUCOSE, BUN, CREATININE, CALCIUM, MG, PHOS in the last 168 hours. Liver Function Tests: No results for input(s): AST, ALT, ALKPHOS, BILITOT, PROT, ALBUMIN in the last 168 hours. No results for input(s): LIPASE, AMYLASE in the last 168 hours. No results for input(s): AMMONIA in the last 168 hours. CBC: No results for input(s): WBC, NEUTROABS, HGB, HCT, MCV, PLT in the last 168 hours. Cardiac Enzymes: No results for input(s): CKTOTAL, CKMB, CKMBINDEX, TROPONINI in the last 168 hours. BNP: Invalid input(s): POCBNP CBG: No results for input(s): GLUCAP in the last 168 hours.  Radiological Exams on Admission: DG Pelvis Portable  Result Date: 10/08/2020 CLINICAL DATA:  Postop right hip EXAM: PORTABLE PELVIS 1-2 VIEWS COMPARISON:   Seventy fluoroscopic image FINDINGS: Interval postsurgical changes from right total hip arthroplasty. Arthroplasty components appear in their expected alignment. No periprosthetic fracture is identified. Expected postoperative changes within the overlying soft tissues. IMPRESSION: Expected postsurgical changes of right hip arthroplasty. Electronically Signed   By: Allegra Lai M.D.   On: 10/08/2020 10:32   DG C-Arm 1-60 Min-No Report  Result Date: 10/08/2020 Fluoroscopy was utilized by the requesting physician.  No radiographic interpretation.   DG HIP OPERATIVE UNILAT W OR W/O PELVIS RIGHT  Result Date: 10/08/2020 CLINICAL DATA:  Hip replacement EXAM: OPERATIVE RIGHT HIP (WITH PELVIS IF PERFORMED) 2 VIEWS TECHNIQUE: Fluoroscopic spot image(s) were submitted for interpretation post-operatively. COMPARISON:  None. FINDINGS: Changes of right hip replacement. Normal AP alignment. No hardware bony complicating feature. IMPRESSION: Right hip replacement.  No visible complicating feature. Electronically Signed   By: Charlett Nose M.D.  On: 10/08/2020 12:38    EKG: None obtained by admitting team.  Time spent: 35 minutes  Camil Hausmann A Zamara Cozad DO [facility]  If 7PM-7AM, please contact night-coverage [email or web address] 10/08/2020, 5:27 PM

## 2020-10-08 NOTE — Care Plan (Signed)
Ortho Bundle Case Management Note  Patient Details  Name: Tracy Lawson MRN: [id number] Date of Birth: [birth year]  R THA on 10-08-20 DCP:  Home with husband.  1 story home with ramp entrance. DME: Has a RW.  3-in-1 ordered through [facility]. PT:  HEP                   DME Arranged:  3-N-1 DME Agency:  [employer]  HH Arranged:  NA HH Agency:  NA  Additional Comments: Please contact me with any questions of if this plan should need to change.  Ennis Forts, RN,CCM [facility]  [phone] 10/08/2020, 8:04 AM

## 2020-10-08 NOTE — Transfer of Care (Signed)
Immediate Anesthesia Transfer of Care Note  Patient: Tracy Lawson  Procedure(s) Performed: TOTAL HIP ARTHROPLASTY ANTERIOR APPROACH (Right: Hip)  Patient Location: PACU  Anesthesia Type:Spinal  Level of Consciousness: drowsy  Airway & Oxygen Therapy: Patient Spontanous Breathing and Patient connected to face mask oxygen  Post-op Assessment: Report given to RN and Post -op Vital signs reviewed and stable  Post vital signs: Reviewed and stable  Last Vitals:  Vitals Value Taken Time  BP 146/62   Temp    Pulse 52   Resp 12   SpO2 100     Last Pain:  Vitals:   10/08/20 0602  TempSrc: Oral  PainSc:       Patients Stated Pain Goal: 3 (10/08/20 0559)  Complications: No notable events documented.

## 2020-10-08 NOTE — Discharge Instructions (Signed)
Frank Aluisio, MD Total Joint Specialist [facility] [address] [place], [place] [address] [phone]  ANTERIOR APPROACH TOTAL HIP REPLACEMENT POSTOPERATIVE DIRECTIONS     Hip Rehabilitation, Guidelines Following Surgery  The results of a hip operation are greatly improved after range of motion and muscle strengthening exercises. Follow all safety measures which are given to protect your hip. If any of these exercises cause increased pain or swelling in your joint, decrease the amount until you are comfortable again. Then slowly increase the exercises. Call your caregiver if you have problems or questions.   BLOOD CLOT PREVENTION Take a 325 mg Aspirin two times a day for three weeks following surgery. Then resume one 81 mg Aspirin once a day. You may resume your vitamins/supplements upon discharge from the hospital. Do not take any NSAIDs (Advil, Aleve, Ibuprofen, Meloxicam, etc.) until you have discontinued the 325 mg Aspirin.  HOME CARE INSTRUCTIONS  Remove items at home which could result in a fall. This includes throw rugs or furniture in walking pathways.  ICE to the affected hip as frequently as 20-30 minutes an hour and then as needed for pain and swelling. Continue to use ice on the hip for pain and swelling from surgery. You may notice swelling that will progress down to the foot and ankle. This is normal after surgery. Elevate the leg when you are not up walking on it.   Continue to use the breathing machine which will help keep your temperature down.  It is common for your temperature to cycle up and down following surgery, especially at night when you are not up moving around and exerting yourself.  The breathing machine keeps your lungs expanded and your temperature down.  DIET You may resume your previous home diet once your are discharged from the hospital.  DRESSING / WOUND CARE / SHOWERING You have an adhesive waterproof bandage over the  incision. Leave this in place until your first follow-up appointment. Once you remove this you will not need to place another bandage.  You may begin showering 3 days following surgery, but do not submerge the incision under water.  ACTIVITY For the first 3-5 days, it is important to rest and keep the operative leg elevated. You should, as a general rule, rest for 50 minutes and walk/stretch for 10 minutes per hour. After 5 days, you may slowly increase activity as tolerated.  Perform the exercises you were provided twice a day for about 15-20 minutes each session. Begin these 2 days following surgery. Walk with your walker as instructed. Use the walker until you are comfortable transitioning to a cane. Walk with the cane in the opposite hand of the operative leg. You may discontinue the cane once you are comfortable and walking steadily. Avoid periods of inactivity such as sitting longer than an hour when not asleep. This helps prevent blood clots.  Do not drive a car for 6 weeks or until released by your surgeon.  Do not drive while taking narcotics.  TED HOSE STOCKINGS Wear the elastic stockings on both legs for three weeks following surgery during the day. You may remove them at night while sleeping.  WEIGHT BEARING Weight bearing as tolerated with assist device (walker, cane, etc) as directed, use it as long as suggested by your surgeon or therapist, typically at least 4-6 weeks.  POSTOPERATIVE CONSTIPATION PROTOCOL Constipation - defined medically as fewer than three stools per week and severe constipation as less than one stool per week.    One of the most common issues patients have following surgery is constipation.  Even if you have a regular bowel pattern at home, your normal regimen is likely to be disrupted due to multiple reasons following surgery.  Combination of anesthesia, postoperative narcotics, change in appetite and fluid intake all can affect your bowels.  In order to avoid  complications following surgery, here are some recommendations in order to help you during your recovery period.  Colace (docusate) - Pick up an over-the-counter form of Colace or another stool softener and take twice a day as long as you are requiring postoperative pain medications.  Take with a full glass of water daily.  If you experience loose stools or diarrhea, hold the colace until you stool forms back up.  If your symptoms do not get better within 1 week or if they get worse, check with your doctor. Dulcolax (bisacodyl) - Pick up over-the-counter and take as directed by the product packaging as needed to assist with the movement of your bowels.  Take with a full glass of water.  Use this product as needed if not relieved by Colace only.  MiraLax (polyethylene glycol) - Pick up over-the-counter to have on hand.  MiraLax is a solution that will increase the amount of water in your bowels to assist with bowel movements.  Take as directed and can mix with a glass of water, juice, soda, coffee, or tea.  Take if you go more than two days without a movement.Do not use MiraLax more than once per day. Call your doctor if you are still constipated or irregular after using this medication for 7 days in a row.  If you continue to have problems with postoperative constipation, please contact the office for further assistance and recommendations.  If you experience "the worst abdominal pain ever" or develop nausea or vomiting, please contact the office immediatly for further recommendations for treatment.  ITCHING  If you experience itching with your medications, try taking only a single pain pill, or even half a pain pill at a time.  You can also use Benadryl over the counter for itching or also to help with sleep.   MEDICATIONS See your medication summary on the "After Visit Summary" that the nursing staff will review with you prior to discharge.  You may have some home medications which will be placed on  hold until you complete the course of blood thinner medication.  It is important for you to complete the blood thinner medication as prescribed by your surgeon.  Continue your approved medications as instructed at time of discharge.  PRECAUTIONS If you experience chest pain or shortness of breath - call 911 immediately for transfer to the hospital emergency department.  If you develop a fever greater that 101 F, purulent drainage from wound, increased redness or drainage from wound, foul odor from the wound/dressing, or calf pain - CONTACT YOUR SURGEON.                                                   FOLLOW-UP APPOINTMENTS Make sure you keep all of your appointments after your operation with your surgeon and caregivers. You should call the office at the above phone number and make an appointment for approximately two weeks after the date of your surgery or on the date instructed by your surgeon outlined in   the "After Visit Summary".  RANGE OF MOTION AND STRENGTHENING EXERCISES  These exercises are designed to help you keep full movement of your hip joint. Follow your caregiver's or physical therapist's instructions. Perform all exercises about fifteen times, three times per day or as directed. Exercise both hips, even if you have had only one joint replacement. These exercises can be done on a training (exercise) mat, on the floor, on a table or on a bed. Use whatever works the best and is most comfortable for you. Use music or television while you are exercising so that the exercises are a pleasant break in your day. This will make your life better with the exercises acting as a break in routine you can look forward to.  Lying on your back, slowly slide your foot toward your buttocks, raising your knee up off the floor. Then slowly slide your foot back down until your leg is straight again.  Lying on your back spread your legs as far apart as you can without causing discomfort.  Lying on your side,  raise your upper leg and foot straight up from the floor as far as is comfortable. Slowly lower the leg and repeat.  Lying on your back, tighten up the muscle in the front of your thigh (quadriceps muscles). You can do this by keeping your leg straight and trying to raise your heel off the floor. This helps strengthen the largest muscle supporting your knee.  Lying on your back, tighten up the muscles of your buttocks both with the legs straight and with the knee bent at a comfortable angle while keeping your heel on the floor.   POST-OPERATIVE OPIOID TAPER INSTRUCTIONS: It is important to wean off of your opioid medication as soon as possible. If you do not need pain medication after your surgery it is ok to stop day one. Opioids include: Codeine, Hydrocodone(Norco, Vicodin), Oxycodone(Percocet, oxycontin) and hydromorphone amongst others.  Long term and even short term use of opiods can cause: Increased pain response Dependence Constipation Depression Respiratory depression And more.  Withdrawal symptoms can include Flu like symptoms Nausea, vomiting And more Techniques to manage these symptoms Hydrate well Eat regular healthy meals Stay active Use relaxation techniques(deep breathing, meditating, yoga) Do Not substitute Alcohol to help with tapering If you have been on opioids for less than two weeks and do not have pain than it is ok to stop all together.  Plan to wean off of opioids This plan should start within one week post op of your joint replacement. Maintain the same interval or time between taking each dose and first decrease the dose.  Cut the total daily intake of opioids by one tablet each day Next start to increase the time between doses. The last dose that should be eliminated is the evening dose.   IF YOU ARE TRANSFERRED TO A SKILLED REHAB FACILITY If the patient is transferred to a skilled rehab facility following release from the hospital, a list of the current  medications will be sent to the facility for the patient to continue.  When discharged from the skilled rehab facility, please have the facility set up the patient's Home Health Physical Therapy prior to being released. Also, the skilled facility will be responsible for providing the patient with their medications at time of release from the facility to include their pain medication, the muscle relaxants, and their blood thinner medication. If the patient is still at the rehab facility at time of the two week follow   up appointment, the skilled rehab facility will also need to assist the patient in arranging follow up appointment in our office and any transportation needs.  MAKE SURE YOU:  Understand these instructions.  Get help right away if you are not doing well or get worse.    DENTAL ANTIBIOTICS:  In most cases prophylactic antibiotics for Dental procdeures after total joint surgery are not necessary.  Exceptions are as follows:  1. History of prior total joint infection  2. Severely immunocompromised (Organ Transplant, cancer chemotherapy, Rheumatoid biologic meds such as Humera)  3. Poorly controlled diabetes (A1C &gt; 8.0, blood glucose over 200)  If you have one of these conditions, contact your surgeon for an antibiotic prescription, prior to your dental procedure.    Pick up stool softner and laxative for home use following surgery while on pain medications. Do not submerge incision under water. Please use good hand washing techniques while changing dressing each day. May shower starting three days after surgery. Please use a clean towel to pat the incision dry following showers. Continue to use ice for pain and swelling after surgery. Do not use any lotions or creams on the incision until instructed by your surgeon.  

## 2020-10-08 NOTE — Progress Notes (Signed)
Patient arrived to hospital prior to surgery this AM with BP around 200/100 mmHg. This was stabilized in preop and patient was able to undergo total hip arthroplasty without complication. Since admission to the orthopedic floor, she has had continued hypertension with most recent reading 190/80 mmHg. Consulting hospitalist team for assistance with management.  Arther Abbott, PA-C Orthopedic Surgery [facility]

## 2020-10-08 NOTE — Op Note (Signed)
OPERATIVE REPORT- TOTAL HIP ARTHROPLASTY   PREOPERATIVE DIAGNOSIS: Osteoarthritis of the Right hip.   POSTOPERATIVE DIAGNOSIS: Osteoarthritis of the Right  hip.   PROCEDURE: Right total hip arthroplasty, anterior approach.   SURGEON: Ollen Gross, MD   ASSISTANT: Arther Abbott, PA-C  ANESTHESIA:  Spinal  ESTIMATED BLOOD LOSS:-250 mL    DRAINS: Hemovac x1.   COMPLICATIONS: None   CONDITION: PACU - hemodynamically stable.   BRIEF CLINICAL NOTE: Tracy Lawson is a 70 y.o. female who has advanced end-  stage arthritis of their Right  hip with progressively worsening pain and  dysfunction.The patient has failed nonoperative management and presents for  total hip arthroplasty.   PROCEDURE IN DETAIL: After successful administration of spinal  anesthetic, the traction boots for the [facility] bed were placed on both  feet and the patient was placed onto the [facility] bed, boots placed into the leg  holders. The Right hip was then isolated from the perineum with plastic  drapes and prepped and draped in the usual sterile fashion. ASIS and  greater trochanter were marked and a oblique incision was made, starting  at about 1 cm lateral and 2 cm distal to the ASIS and coursing towards  the anterior cortex of the femur. The skin was cut with a 10 blade  through subcutaneous tissue to the level of the fascia overlying the  tensor fascia lata muscle. The fascia was then incised in line with the  incision at the junction of the anterior third and posterior 2/3rd. The  muscle was teased off the fascia and then the interval between the TFL  and the rectus was developed. The Hohmann retractor was then placed at  the top of the femoral neck over the capsule. The vessels overlying the  capsule were cauterized and the fat on top of the capsule was removed.  A Hohmann retractor was then placed anterior underneath the rectus  femoris to give exposure to the entire anterior capsule. A T-shaped   capsulotomy was performed. The edges were tagged and the femoral head  was identified.       Osteophytes are removed off the superior acetabulum.  The femoral neck was then cut in situ with an oscillating saw. Traction  was then applied to the left lower extremity utilizing the [facility]  traction. The femoral head was then removed. Retractors were placed  around the acetabulum and then circumferential removal of the labrum was  performed. Osteophytes were also removed. Reaming starts at 45 mm to  medialize and  Increased in 2 mm increments to 47 mm. We reamed in  approximately 40 degrees of abduction, 20 degrees anteversion. A 48 mm  pinnacle acetabular shell was then impacted in anatomic position under  fluoroscopic guidance with excellent purchase. We did not need to place  any additional dome screws. A 28 mm Neutral + 4 marathon liner was then  placed into the acetabular shell.       The femoral lift was then placed along the lateral aspect of the femur  just distal to the vastus ridge. The leg was  externally rotated and capsule  was stripped off the inferior aspect of the femoral neck down to the  level of the lesser trochanter, this was done with electrocautery. The femur was lifted after this was performed. The  leg was then placed in an extended and adducted position essentially delivering the femur. We also removed the capsule superiorly and the piriformis from the piriformis  fossa to gain excellent exposure of the  proximal femur. Rongeur was used to remove some cancellous bone to get  into the lateral portion of the proximal femur for placement of the  initial starter reamer. The starter broaches was placed  the starter broach  and was shown to go down the center of the canal. Broaching  with the Actis system was then performed starting at size 0  coursing  Up to size 5. A size 5 had excellent torsional and rotational  and axial stability. The trial standard offset neck was then  placed  with a 28 + 5 trial head. The hip was then reduced. We confirmed that  the stem was in the canal both on AP and lateral x-rays. It also has excellent sizing. The hip was reduced with outstanding stability through full extension and full external rotation.. AP pelvis was taken and the leg lengths were measured and found to be equal. Hip was then dislocated again and the femoral head and neck removed. The  femoral broach was removed. Size 5 Actis stem with a standard offset  neck was then impacted into the femur following native anteversion. Has  excellent purchase in the canal. Excellent torsional and rotational and  axial stability. It is confirmed to be in the canal on AP and lateral  fluoroscopic views. The 28 + 5 ceramic head was placed and the hip  reduced with outstanding stability. Again AP pelvis was taken and it  confirmed that the leg lengths were equal. The wound was then copiously  irrigated with saline solution and the capsule reattached and repaired  with Ethibond suture. 30 ml of .25% Bupivicaine was  injected into the capsule and into the edge of the tensor fascia lata as well as subcutaneous tissue. The fascia overlying the tensor fascia lata was then closed with a running #1 V-Loc. Subcu was closed with interrupted 2-0 Vicryl and subcuticular running 4-0 Monocryl. Incision was cleaned  and dried. Steri-Strips and a bulky sterile dressing applied. The patient was awakened and transported to  recovery in stable condition.        Please note that a surgical assistant was a medical necessity for this procedure to perform it in a safe and expeditious manner. Assistant was necessary to provide appropriate retraction of vital neurovascular structures and to prevent femoral fracture and allow for anatomic placement of the prosthesis.  Ollen Gross, M.D.

## 2020-10-08 NOTE — Anesthesia Postprocedure Evaluation (Signed)
Anesthesia Post Note  Patient: Tracy Lawson  Procedure(s) Performed: TOTAL HIP ARTHROPLASTY ANTERIOR APPROACH (Right: Hip)     Patient location during evaluation: PACU Anesthesia Type: Spinal Level of consciousness: awake and alert Pain management: pain level controlled Vital Signs Assessment: post-procedure vital signs reviewed and stable Respiratory status: spontaneous breathing and respiratory function stable Cardiovascular status: blood pressure returned to baseline and stable Postop Assessment: spinal receding Anesthetic complications: no   No notable events documented.  Last Vitals:  Vitals:   10/08/20 1015 10/08/20 1030  BP: (!) 177/79 (!) 176/81  Pulse: (!) 53 (!) 51  Resp: 13 11  Temp:    SpO2: 100% 100%    Last Pain:  Vitals:   10/08/20 1030  TempSrc:   PainSc: Asleep                 Gabe Glace DANIEL

## 2020-10-08 NOTE — Anesthesia Procedure Notes (Signed)
Spinal  Patient location during procedure: OR Start time: 10/08/2020 7:22 AM End time: 10/08/2020 7:32 AM Reason for block: surgical anesthesia Staffing Performed: anesthesiologist  Anesthesiologist: Heather Roberts, MD Preanesthetic Checklist Completed: patient identified, IV checked, risks and benefits discussed, surgical consent, monitors and equipment checked, pre-op evaluation and timeout performed Spinal Block Patient position: sitting Prep: DuraPrep Patient monitoring: cardiac monitor, continuous pulse ox and blood pressure Approach: midline Location: L2-3 Injection technique: single-shot Needle Needle type: Pencan  Needle gauge: 24 G Needle length: 9 cm Assessment Events: CSF return Additional Notes Functioning IV was confirmed and monitors were applied. Sterile prep and drape, including hand hygiene and sterile gloves were used. The patient was positioned and the spine was prepped. The skin was anesthetized with lidocaine.  Free flow of clear CSF was obtained prior to injecting local anesthetic into the CSF.  The spinal needle aspirated freely following injection.  The needle was carefully withdrawn.  The patient tolerated the procedure well.

## 2020-10-08 NOTE — Interval H&P Note (Signed)
History and Physical Interval Note:  10/08/2020 6:27 AM  Tracy Lawson  has presented today for surgery, with the diagnosis of right hip osteoarthritis.  The various methods of treatment have been discussed with the patient and family. After consideration of risks, benefits and other options for treatment, the patient has consented to  Procedure(s): TOTAL HIP ARTHROPLASTY ANTERIOR APPROACH (Right) as a surgical intervention.  The patient's history has been reviewed, patient examined, no change in status, stable for surgery.  I have reviewed the patient's chart and labs.  Questions were answered to the patient's satisfaction.     [name]

## 2020-10-09 ENCOUNTER — Other Ambulatory Visit: Payer: Self-pay

## 2020-10-09 DIAGNOSIS — E876 Hypokalemia: Secondary | ICD-10-CM

## 2020-10-09 DIAGNOSIS — Z87891 Personal history of nicotine dependence: Secondary | ICD-10-CM | POA: Diagnosis not present

## 2020-10-09 DIAGNOSIS — M1611 Unilateral primary osteoarthritis, right hip: Secondary | ICD-10-CM | POA: Diagnosis not present

## 2020-10-09 DIAGNOSIS — Z79899 Other long term (current) drug therapy: Secondary | ICD-10-CM | POA: Diagnosis not present

## 2020-10-09 DIAGNOSIS — I10 Essential (primary) hypertension: Secondary | ICD-10-CM | POA: Diagnosis not present

## 2020-10-09 LAB — BASIC METABOLIC PANEL
Anion gap: 5 (ref 5–15)
BUN: 16 mg/dL (ref 8–23)
CO2: 25 mmol/L (ref 22–32)
Calcium: 8.3 mg/dL — ABNORMAL LOW (ref 8.9–10.3)
Chloride: 105 mmol/L (ref 98–111)
Creatinine, Ser: 0.76 mg/dL (ref 0.44–1.00)
GFR, Estimated: >60 mL/min (ref >60)
Glucose, Bld: 128 mg/dL — ABNORMAL HIGH (ref 70–99)
Potassium: 3.2 mmol/L — ABNORMAL LOW (ref 3.5–5.1)
Sodium: 135 mmol/L (ref 135–145)

## 2020-10-09 LAB — CBC
HCT: 32.7 % — ABNORMAL LOW (ref 36.0–46.0)
Hemoglobin: 11.1 g/dL — ABNORMAL LOW (ref 12.0–15.0)
MCH: 29.9 pg (ref 26.0–34.0)
MCHC: 33.9 g/dL (ref 30.0–36.0)
MCV: 88.1 fL (ref 80.0–100.0)
Platelets: 170 10*3/uL (ref 150–400)
RBC: 3.71 MIL/uL — ABNORMAL LOW (ref 3.87–5.11)
RDW: 12.9 % (ref 11.5–15.5)
WBC: 8.3 10*3/uL (ref 4.0–10.5)
nRBC: 0 % (ref 0.0–0.2)

## 2020-10-09 LAB — MAGNESIUM: Magnesium: 1.9 mg/dL (ref 1.7–2.4)

## 2020-10-09 MED ORDER — POTASSIUM CHLORIDE CRYS ER 20 MEQ PO TBCR
40.0000 meq | EXTENDED_RELEASE_TABLET | Freq: Once | ORAL | Status: AC
  Administered 2020-10-09: 40 meq via ORAL
  Filled 2020-10-09: qty 2

## 2020-10-09 MED ORDER — HYDROCODONE-ACETAMINOPHEN 5-325 MG PO TABS: 1.0000 | ORAL_TABLET | Freq: Four times a day (QID) | ORAL | 0 refills | Status: DC | PRN

## 2020-10-09 MED ORDER — ASPIRIN 325 MG PO TBEC: 325.0000 mg | DELAYED_RELEASE_TABLET | Freq: Two times a day (BID) | ORAL | 0 refills | Status: DC

## 2020-10-09 MED ORDER — METHOCARBAMOL 500 MG PO TABS
500.0000 mg | ORAL_TABLET | Freq: Four times a day (QID) | ORAL | 0 refills | Status: DC | PRN
Start: 2020-10-09 — End: 2023-03-21

## 2020-10-09 MED ORDER — TRAMADOL HCL 50 MG PO TABS: 50.0000 mg | ORAL_TABLET | Freq: Four times a day (QID) | ORAL | 0 refills | Status: DC | PRN

## 2020-10-09 NOTE — Progress Notes (Signed)
   Subjective: 1 Day Post-Op Procedure(s) (LRB): TOTAL HIP ARTHROPLASTY ANTERIOR APPROACH (Right) Patient reports pain as mild.   Patient seen in rounds by Dr. Lequita Halt. Patient is doing well, but had issues controlling blood pressure yesterday, which has improved some this morning. Denies SOB, chest pain, or calf pain. No acute overnight events. Struggled with pain during therapy yesterday. Will continue therapy today. Foley pulled this am.   Objective: Vital signs in last 24 hours: Temp:  [97.4 F (36.3 C)-98.8 F (37.1 C)] 98.5 F (36.9 C) (09/29 0655) Pulse Rate:  [48-82] 82 (09/29 0655) Resp:  [10-16] 10 (09/29 0655) BP: (123-199)/(67-98) 164/75 (09/29 0655) SpO2:  [95 %-100 %] 96 % (09/29 0655)  Intake/Output from previous day:  Intake/Output Summary (Last 24 hours) at 10/09/2020 0736 Last data filed at 10/09/2020 0655 Gross per 24 hour  Intake 1980.35 ml  Output 3000 ml  Net -1019.65 ml     Intake/Output this shift: No intake/output data recorded.  Labs: Recent Labs    10/09/20 0316  HGB 11.1*   Recent Labs    10/09/20 0316  WBC 8.3  RBC 3.71*  HCT 32.7*  PLT 170   Recent Labs    10/09/20 0316  NA 135  K 3.2*  CL 105  CO2 25  BUN 16  CREATININE 0.76  GLUCOSE 128*  CALCIUM 8.3*   No results for input(s): LABPT, INR in the last 72 hours.  Exam: General - Patient is Alert and Oriented Extremity - Neurologically intact Neurovascular intact Intact pulses distally Dorsiflexion/Plantar flexion intact Dressing - dressing C/D/I Motor Function - intact, moving foot and toes well on exam.   Past Medical History:  Diagnosis Date   Arthritis    Localization-related (focal) (partial) epilepsy and epileptic syndromes with complex partial seizures, without mention of intractable epilepsy    Pain in limb    Seizures (HCC)    Sleep disturbance, unspecified    Unspecified essential hypertension     Assessment/Plan: 1 Day Post-Op Procedure(s)  (LRB): TOTAL HIP ARTHROPLASTY ANTERIOR APPROACH (Right) Principal Problem:   OA (osteoarthritis) of hip Active Problems:   Primary osteoarthritis of right hip  Estimated body mass index is 31.46 kg/m as calculated from the following:   Height as of this encounter: 5' 5.5" (1.664 m).   Weight as of this encounter: 87.1 kg. Up with therapy  DVT Prophylaxis - Aspirin and TED hose Weight bearing as tolerated. Continue therapy.  Plan is to go Home after hospital stay.   Hospitalist consulted yesterday regarding BP - recommended patient continue home BP regimen, and added PRN Hydralazine last night which helped. Will continue to monitor. Thank you for the consult.   Plan for two sessions with PT this morning, and if meeting goals and blood pressure continues to stabilize, will plan for discharge this afternoon.   Patient to follow up in two weeks with Dr. Lequita Halt in clinic.   The PDMP database was reviewed today prior to any opioid medications being prescribed to this patient.Nelia Shi, MBA, PA-C Orthopedic Surgery [phone] 10/09/2020, 7:36 AM

## 2020-10-09 NOTE — Plan of Care (Signed)
Plan of care reviewed and discussed with the patient and family. 

## 2020-10-09 NOTE — Plan of Care (Signed)

## 2020-10-09 NOTE — TOC Transition Note (Signed)
Transition of Care [facility]) - CM/SW Discharge Note   Patient Details  Name: Tracy Lawson MRN: [id number] Date of Birth: 22-May-1950  Transition of Care [facility] CM/SW Contact:  Lennart Pall, LCSW Phone Number: 10/09/2020, 10:54 AM   Clinical Narrative:    Met with pt and daughter-in-law who confirm they chose to decline ordered 3n1 due to non-coverage by insurance.  Plan HEP.  No further TOC needs.   Final next level of care: Home/Self Care Barriers to Discharge: No Barriers Identified   Patient Goals and CMS Choice Patient states their goals for this hospitalization and ongoing recovery are:: return home      Discharge Placement                       Discharge Plan and Services                DME Arranged:  (declined due to out of pocket cost) DME Agency: [employer]       HH Arranged: NA HH Agency: NA        Social Determinants of Health (SDOH) Interventions     Readmission Risk Interventions No flowsheet data found.

## 2020-10-09 NOTE — Progress Notes (Signed)
Physical Therapy Treatment Patient Details Name: Tracy Lawson MRN: [id number] DOB: 1950-03-20 Today's Date: 10/09/2020   History of Present Illness s/p R DA THA. PMH: HTN, complex partial Szs    PT Comments    POD # 1 pm session Assisted with amb a functional distance in hallway.  Assisted to bathroom then back to recliner to perform TE's.  Then returned to room to perform some TE's following HEP handout.  Instructed on proper tech, freq as well as use of ICE.   Addressed all mobility questions, discussed appropriate activity, educated on use of ICE.  Pt ready for D/C to home. Pt plans to D/C to home today with family support.    Recommendations for follow up therapy are one component of a multi-disciplinary discharge planning process, led by the attending physician.  Recommendations may be updated based on patient status, additional functional criteria and insurance authorization.  Follow Up Recommendations  Follow surgeon's recommendation for DC plan and follow-up therapies     Equipment Recommendations  None recommended by PT    Recommendations for Other Services       Precautions / Restrictions Precautions Precautions: Fall Restrictions Weight Bearing Restrictions: No Other Position/Activity Restrictions: WBAT     Mobility  Bed Mobility               General bed mobility comments: OOB in recliner    Transfers Overall transfer level: Needs assistance Equipment used: Rolling walker (2 wheeled) Transfers: Sit to/from [employer] Sit to Stand: Supervision Stand pivot transfers: Supervision;Min guard       General transfer comment: cues for hand placement and RLE position; assist to steady for stand pivot, verbal cues for sequence and RW position.  Increased time.  Ambulation/Gait Ambulation/Gait assistance: Supervision;Min guard Gait Distance (Feet): 42 Feet Assistive device: Rolling walker (2 wheeled) Gait Pattern/deviations: Step-to  pattern;Decreased stance time - right;Trunk flexed Gait velocity: decreased   General Gait Details: tolerated amb in hallway then to and from bathroom with increased time.  Amb a functional distance to D/C to home today with family assist.   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  Total Hip Replacement TE's following HEP Handout 10 reps ankle pumps 05 reps knee presses 05 reps heel slides 05 reps SAQ's 05 reps ABD Instructed how to use a belt loop to assist  Followed by ICE     General Comments        Pertinent Vitals/Pain Pain Location: right hip Pain Descriptors / Indicators: Grimacing;Sore;Operative site guarding;Tender Pain Intervention(s): Monitored during session;Premedicated before session;Repositioned;Ice applied    Home Living  Prior Function            PT Goals (current goals can now be found in the care plan section) Progress towards PT goals: Progressing toward goals    Frequency    7X/week      PT Plan Current plan remains appropriate    Co-evaluation              AM-PAC PT "6 Clicks" Mobility   Outcome Measure  Help needed turning from your back to your side while in a flat bed without using bedrails?: A Little Help needed moving from lying on your back to sitting on the side of a flat bed without using bedrails?: A Little Help needed moving to and from a bed to a chair (including a wheelchair)?: A Little Help needed standing up from a chair using your arms (e.g.,  wheelchair or bedside chair)?: A Little Help needed to walk in hospital room?: A Little Help needed climbing 3-5 steps with a railing? : A Little 6 Click Score: 18    End of Session Equipment Utilized During Treatment: Gait belt Activity Tolerance: Patient tolerated treatment well Patient left: in chair;with call bell/phone within reach;with chair alarm set Nurse Communication: Mobility status (ready for D/C to home) PT Visit Diagnosis: Other abnormalities of gait and mobility (R26.89);Difficulty in walking, not elsewhere classified (R26.2)     Time: 1315-1340 PT Time Calculation (min) (ACUTE ONLY): 25 min  Charges:  $Gait Training: 8-22 mins $Therapeutic Exercise: 8-22 mins                     Felecia Shelling  PTA Acute  Rehabilitation Services Pager      [phone] Office      [phone]

## 2020-10-09 NOTE — Progress Notes (Signed)
[facility] PROGRESS NOTE   Tracy Lawson VFI:[id number] DOB: Mar 14, 1950 DOA: 10/08/2020  PCP: Kirstie Peri, MD  Brief History/Interval Summary: 70 y.o. female with medical history significant of HTN, seizures.  Patient was hospitalized by orthopedic service for total hip replacement after failure of conservative management.  She underwent surgery on 9/28.  Was noted to have extremely elevated blood pressures.  Medical service was consulted for same.      Subjective/Interval History: Patient's daughter-in-law is at the bedside.  Patient currently has 2 out of 10 pain in the right eye.  Whenever she moves it goes up to 8 out of 10 pain.  Denies any chest pain shortness of breath.  No headaches.  No nausea vomiting.   Assessment/Plan:  Accelerated hypertension Patient with known history of hypertension and is on labetalol, amlodipine and clonidine at home prior to admission.  She denies any history of recent noncompliance.  Blood pressures were noted to be greater than 190 systolic yesterday.   Appears to be multifactorial including being off of her antihypertensives in the hospital, pain issues, anxiety.  She was started back on her antihypertensive regimen.  Blood pressure appears to have improved.   Patient is also on furosemide which is being continued.  Replace potassium.  Magnesium is 1.9.  Patient waiting to work with physical therapy today.   If blood pressure remains reasonably well controlled with systolic blood pressures less than 175 and as long as patient is asymptomatic, patient could go home if otherwise cleared by orthopedic service.  Pain needs to be adequately controlled.  She will need to monitor blood pressures at home and follow-up with the primary care provider.  Right hip pain status post total hip arthroplasty Management per orthopedics.  Obesity Estimated body mass index is 31.46 kg/m as calculated from the following:   Height as of this encounter: 5'  5.5" (1.664 m).   Weight as of this encounter: 87.1 kg.    Medications: Prior to Admission:  Medications Prior to Admission  Medication Sig Dispense Refill Last Dose   amLODipine (NORVASC) 10 MG tablet Take 10 mg by mouth daily.   10/08/2020 at 0445   aspirin 81 MG tablet Take 81 mg by mouth daily.   10/05/2020   atorvastatin (LIPITOR) 20 MG tablet Take 20 mg by mouth daily.   10/08/2020 at 0445   Calcium Carbonate-Vitamin D3 600-400 MG-UNIT TABS Take 1 tablet by mouth daily.   10/07/2020   carbamazepine (CARBATROL) 300 MG 12 hr capsule TAKE 1 CAPSULE BY MOUTH EVERY MORNING AND TAKE TWO CAPSULES BY MOUTH AT BEDTIME 270 capsule 3 10/07/2020   cloNIDine (CATAPRES) 0.3 MG tablet Take 0.3 mg by mouth 2 (two) times daily.   10/08/2020 at 0445   [facility] Liver Oil 1000 MG CAPS Take 1,000 mg by mouth daily.   Past Week   furosemide (LASIX) 40 MG tablet Take 40 mg by mouth 2 (two) times daily.    10/06/2020   labetalol (NORMODYNE) 300 MG tablet Take 300 mg by mouth 2 (two) times daily.   10/08/2020 at 0445   levETIRAcetam (KEPPRA) 750 MG tablet TAKE 1 TABLET BY MOUTH EVERY TWELVE HOURS (Patient taking differently: Take 750 mg by mouth 2 (two) times daily.) 180 tablet 3 10/08/2020 at 0445   meloxicam (MOBIC) 15 MG tablet Take 1 tablet (15 mg total) by mouth daily. 30 tablet 0 10/02/2020   Omega-3 Fatty Acids (OMEGA 3 500) 500 MG CAPS Take 1 capsule by mouth daily.  Past Week   potassium chloride SA (K-DUR,KLOR-CON) 20 MEQ tablet Take 20 mEq by mouth daily.   10/08/2020 at 0445   Vitamin D, Ergocalciferol, (DRISDOL) 1.25 MG (50000 UNIT) CAPS capsule Take 50,000 Units by mouth every Monday.   10/07/2020   zinc gluconate 50 MG tablet Take 50 mg by mouth 2 (two) times a week.   Past Week   Scheduled:  amLODipine  10 mg Oral Daily   aspirin EC  325 mg Oral BID   atorvastatin  20 mg Oral Daily   carbamazepine  300 mg Oral Daily   carbamazepine  600 mg Oral QHS   Chlorhexidine Gluconate Cloth  6 each Topical Daily    cloNIDine  0.3 mg Oral BID   dexamethasone (DECADRON) injection  10 mg Intravenous Once   docusate sodium  100 mg Oral BID   furosemide  40 mg Oral BID   labetalol  300 mg Oral BID   levETIRAcetam  750 mg Oral BID   potassium chloride SA  20 mEq Oral Daily   potassium chloride  40 mEq Oral Once   Continuous:  sodium chloride 75 mL/hr at 10/08/20 1340   methocarbamol (ROBAXIN) IV      Antibiotics: Anti-infectives (From admission, onward)    Start     Dose/Rate Route Frequency Ordered Stop   10/08/20 1400  ceFAZolin (ANCEF) IVPB 2g/100 mL premix        2 g 200 mL/hr over 30 Minutes Intravenous Every 6 hours 10/08/20 1256 10/08/20 2040   10/08/20 0600  ceFAZolin (ANCEF) IVPB 2g/100 mL premix        2 g 200 mL/hr over 30 Minutes Intravenous On call to O.R. 10/08/20 0522 10/08/20 0745       Objective:  Vital Signs  Vitals:   10/08/20 1814 10/08/20 2204 10/09/20 0201 10/09/20 0655  BP: (!) 123/98 (!) 162/67 (!) 172/85 (!) 164/75  Pulse:  65 77 82  Resp:  12 12 10   Temp:  98.8 F (37.1 C) 98.3 F (36.8 C) 98.5 F (36.9 C)  TempSrc:  Oral Oral Oral  SpO2:  95% 96% 96%  Weight:      Height:        Intake/Output Summary (Last 24 hours) at 10/09/2020 0951 Last data filed at 10/09/2020 0844 Gross per 24 hour  Intake 980.35 ml  Output 2850 ml  Net -1869.65 ml   Filed Weights   10/08/20 0559  Weight: 87.1 kg    General appearance: Awake alert.  In no distress Resp: Clear to auscultation bilaterally.  Normal effort Cardio: S1-S2 is normal regular.  No S3-S4.  No rubs murmurs or bruit GI: Abdomen is soft.  Nontender nondistended.  Bowel sounds are present normal.  No masses organomegaly   Lab Results:  Data Reviewed: I have personally reviewed following labs and imaging studies  CBC: Recent Labs  Lab 10/09/20 0316  WBC 8.3  HGB 11.1*  HCT 32.7*  MCV 88.1  PLT 170    Basic Metabolic Panel: Recent Labs  Lab 10/09/20 0316  NA 135  K 3.2*  CL 105  CO2  25  GLUCOSE 128*  BUN 16  CREATININE 0.76  CALCIUM 8.3*  MG 1.9    GFR: Estimated Creatinine Clearance: 73.1 mL/min (by C-G formula based on SCr of 0.76 mg/dL).    Recent Results (from the past 240 hour(s))  SARS Coronavirus 2 (TAT 6-24 hrs)     Status: None   Collection Time: 10/06/20 12:00 AM  Result Value Ref Range Status   SARS Coronavirus 2 RESULT: NEGATIVE  Final    Comment: RESULT: NEGATIVESARS-CoV-2 INTERPRETATION:A NEGATIVE  test result means that SARS-CoV-2 RNA was not present in the specimen above the limit of detection of this test. This does not preclude a possible SARS-CoV-2 infection and should not be used as the  sole basis for patient management decisions. Negative results must be combined with clinical observations, patient history, and epidemiological information. Optimum specimen types and timing for peak viral levels during infections caused by SARS-CoV-2  have not been determined. Collection of multiple specimens or types of specimens may be necessary to detect virus. Improper specimen collection and handling, sequence variability under primers/probes, or organism present below the limit of detection may  lead to false negative results. Positive and negative predictive values of testing are highly dependent on prevalence. False negative test results are more likely when prevalence of disease is high.The expected result is NEGATIVE.Fact S heet for  Healthcare Providers: [employer] Sheet for Patients: https://poole-fre[employer].org/ Reference Range - Negative       Radiology Studies: DG Pelvis Portable  Result Date: 10/08/2020 CLINICAL DATA:  Postop right hip EXAM: PORTABLE PELVIS 1-2 VIEWS COMPARISON:  Seventy fluoroscopic image FINDINGS: Interval postsurgical changes from right total hip arthroplasty. Arthroplasty components appear in their expected alignment. No periprosthetic fracture is identified. Expected  postoperative changes within the overlying soft tissues. IMPRESSION: Expected postsurgical changes of right hip arthroplasty. Electronically Signed   By: Allegra Lai M.D.   On: 10/08/2020 10:32   DG C-Arm 1-60 Min-No Report  Result Date: 10/08/2020 Fluoroscopy was utilized by the requesting physician.  No radiographic interpretation.   DG HIP OPERATIVE UNILAT W OR W/O PELVIS RIGHT  Result Date: 10/08/2020 CLINICAL DATA:  Hip replacement EXAM: OPERATIVE RIGHT HIP (WITH PELVIS IF PERFORMED) 2 VIEWS TECHNIQUE: Fluoroscopic spot image(s) were submitted for interpretation post-operatively. COMPARISON:  None. FINDINGS: Changes of right hip replacement. Normal AP alignment. No hardware bony complicating feature. IMPRESSION: Right hip replacement.  No visible complicating feature. Electronically Signed   By: Charlett Nose M.D.   On: 10/08/2020 12:38       LOS: 0 days   Carita Sollars Rito Ehrlich  [facility] Pager on [email or web address]  10/09/2020, 9:51 AM

## 2020-10-09 NOTE — Progress Notes (Signed)
Provided discharge education/instructions, all questions and concerns address. Pt not in acute distress, discharged home with belongings accompanied by daughter in law.

## 2020-10-10 DIAGNOSIS — I10 Essential (primary) hypertension: Secondary | ICD-10-CM | POA: Diagnosis not present

## 2020-10-11 ENCOUNTER — Encounter (HOSPITAL_COMMUNITY): Payer: Self-pay | Admitting: Orthopedic Surgery

## 2020-10-17 DIAGNOSIS — R509 Fever, unspecified: Secondary | ICD-10-CM | POA: Diagnosis not present

## 2020-10-17 DIAGNOSIS — I10 Essential (primary) hypertension: Secondary | ICD-10-CM | POA: Diagnosis not present

## 2020-10-17 DIAGNOSIS — Z6832 Body mass index (BMI) 32.0-32.9, adult: Secondary | ICD-10-CM | POA: Diagnosis not present

## 2020-10-17 DIAGNOSIS — Z299 Encounter for prophylactic measures, unspecified: Secondary | ICD-10-CM | POA: Diagnosis not present

## 2020-10-17 DIAGNOSIS — R011 Cardiac murmur, unspecified: Secondary | ICD-10-CM | POA: Diagnosis not present

## 2020-10-17 DIAGNOSIS — L0390 Cellulitis, unspecified: Secondary | ICD-10-CM | POA: Diagnosis not present

## 2020-10-21 NOTE — Discharge Summary (Signed)
Physician Discharge Summary   Patient ID: Tracy Lawson MRN: [id number] DOB/AGE: [birth year] 70 y.o.  Admit date: 10/08/2020 Discharge date: 10/09/2020  Primary Diagnosis:  s/p Right THA  Admission Diagnoses:  Past Medical History:  Diagnosis Date   Arthritis    Localization-related (focal) (partial) epilepsy and epileptic syndromes with complex partial seizures, without mention of intractable epilepsy    Pain in limb    Seizures (HCC)    Sleep disturbance, unspecified    Unspecified essential hypertension    Discharge Diagnoses:   Principal Problem:   OA (osteoarthritis) of hip Active Problems:   Primary osteoarthritis of right hip  Estimated body mass index is 31.46 kg/m as calculated from the following:   Height as of this encounter: 5' 5.5" (1.664 m).   Weight as of this encounter: 87.1 kg.  Procedure:  Procedure(s) (LRB): TOTAL HIP ARTHROPLASTY ANTERIOR APPROACH (Right)   Consults: None  HPI: Tracy Lawson is a 70 y.o. female who has advanced end-  stage arthritis of their Right  hip with progressively worsening pain and  dysfunction.The patient has failed nonoperative management and presents for  total hip arthroplasty.   Laboratory Data: Admission on 10/08/2020, Discharged on 10/09/2020  Component Date Value Ref Range Status   ABO/RH(D) 10/08/2020    Final                   Value:O POS Performed at [facility], [address], [place], [place] [address]    WBC 10/09/2020 8.3  4.0 - 10.5 K/uL Final   RBC 10/09/2020 3.71 (A) 3.87 - 5.11 MIL/uL Final   Hemoglobin 10/09/2020 11.1 (A) 12.0 - 15.0 g/dL Final   HCT 68/37/2902 32.7 (A) 36.0 - 46.0 % Final   MCV 10/09/2020 88.1  80.0 - 100.0 fL Final   MCH 10/09/2020 29.9  26.0 - 34.0 pg Final   MCHC 10/09/2020 33.9  30.0 - 36.0 g/dL Final   RDW 11/26/5206 12.9  11.5 - 15.5 % Final   Platelets 10/09/2020 170  150 - 400 K/uL Final   nRBC 10/09/2020 0.0  0.0 - 0.2 % Final   Performed at  [facility], [address] [place], [place] [address]   Sodium 10/09/2020 135  135 - 145 mmol/L Final   Potassium 10/09/2020 3.2 (A) 3.5 - 5.1 mmol/L Final   Chloride 10/09/2020 105  98 - 111 mmol/L Final   CO2 10/09/2020 25  22 - 32 mmol/L Final   Glucose, Bld 10/09/2020 128 (A) 70 - 99 mg/dL Final   Glucose reference range applies only to samples taken after fasting for at least 8 hours.   BUN 10/09/2020 16  8 - 23 mg/dL Final   Creatinine, Ser 10/09/2020 0.76  0.44 - 1.00 mg/dL Final   Calcium 61/22/4497 8.3 (A) 8.9 - 10.3 mg/dL Final   GFR, Estimated 10/09/2020 >60  >60 mL/min Final   Comment: (NOTE) Calculated using the CKD-EPI Creatinine Equation (2021)    Anion gap 10/09/2020 5  5 - 15 Final   Performed at [facility], [address] [place], [place] [address]   Magnesium 10/09/2020 1.9  1.7 - 2.4 mg/dL Final   Performed at [facility], [address] [place], [place] [address]  Orders Only on 10/06/2020  Component Date Value Ref Range Status   SARS Coronavirus 2 10/06/2020 RESULT: NEGATIVE   Final   Comment: RESULT: NEGATIVESARS-CoV-2 INTERPRETATION:A NEGATIVE  test result means that SARS-CoV-2  RNA was not present in the specimen above the limit of detection of this test. This does not preclude a possible SARS-CoV-2 infection and should not be used as the  sole basis for patient management decisions. Negative results must be combined with clinical observations, patient history, and epidemiological information. Optimum specimen types and timing for peak viral levels during infections caused by SARS-CoV-2  have not been determined. Collection of multiple specimens or types of specimens may be necessary to detect virus. Improper specimen collection and handling, sequence variability under primers/probes, or organism present below the limit of detection may  lead to false negative results. Positive and negative predictive  values of testing are highly dependent on prevalence. False negative test results are more likely when prevalence of disease is high.The expected result is NEGATIVE.Fact S                          heet for  Healthcare Providers: [employer] Sheet for Patients: https://poole-freeman.org/ Reference Range - Negative   Hospital Outpatient Visit on 09/25/2020  Component Date Value Ref Range Status   MRSA, PCR 09/25/2020 NEGATIVE  NEGATIVE Final   Staphylococcus aureus 09/25/2020 POSITIVE (A) NEGATIVE Final   Comment: (NOTE) The Xpert SA Assay (FDA approved for NASAL specimens in patients 59 years of age and older), is one component of a comprehensive surveillance program. It is not intended to diagnose infection nor to guide or monitor treatment. Performed at [facility], [address], [place], [place] [address]    WBC 09/25/2020 3.9 (A) 4.0 - 10.5 K/uL Final   RBC 09/25/2020 4.38  3.87 - 5.11 MIL/uL Final   Hemoglobin 09/25/2020 13.0  12.0 - 15.0 g/dL Final   HCT 97/98/9211 39.9  36.0 - 46.0 % Final   MCV 09/25/2020 91.1  80.0 - 100.0 fL Final   MCH 09/25/2020 29.7  26.0 - 34.0 pg Final   MCHC 09/25/2020 32.6  30.0 - 36.0 g/dL Final   RDW 94/17/4081 12.8  11.5 - 15.5 % Final   Platelets 09/25/2020 218  150 - 400 K/uL Final   nRBC 09/25/2020 0.0  0.0 - 0.2 % Final   Performed at [facility], [address] [place], [place] [address]   Sodium 09/25/2020 137  135 - 145 mmol/L Final   Potassium 09/25/2020 3.7  3.5 - 5.1 mmol/L Final   Chloride 09/25/2020 104  98 - 111 mmol/L Final   CO2 09/25/2020 28  22 - 32 mmol/L Final   Glucose, Bld 09/25/2020 120 (A) 70 - 99 mg/dL Final   Glucose reference range applies only to samples taken after fasting for at least 8 hours.   BUN 09/25/2020 19  8 - 23 mg/dL Final   Creatinine, Ser 09/25/2020 0.76  0.44 - 1.00 mg/dL Final   Calcium 56/31/4970 8.9  8.9 -  10.3 mg/dL Final   Total Protein 26/37/8588 7.8  6.5 - 8.1 g/dL Final   Albumin 50/27/7412 3.9  3.5 - 5.0 g/dL Final   AST 87/86/7672 24  15 - 41 U/L Final   ALT 09/25/2020 18  0 - 44 U/L Final   Alkaline Phosphatase 09/25/2020 105  38 - 126 U/L Final   Total Bilirubin 09/25/2020 0.6  0.3 - 1.2 mg/dL Final   GFR, Estimated 09/25/2020 >60  >60 mL/min Final   Comment: (NOTE) Calculated using the CKD-EPI Creatinine Equation (2021)    Anion gap 09/25/2020 5  5 - 15 Final  Performed at [facility], [address] [place], [place] [address]   Prothrombin Time 09/25/2020 13.9  11.4 - 15.2 seconds Final   INR 09/25/2020 1.1  0.8 - 1.2 Final   Comment: (NOTE) INR goal varies based on device and disease states. Performed at [facility], [address], [place], [place] [address]    ABO/RH(D) 09/25/2020 O POS   Final   Antibody Screen 09/25/2020 NEG   Final   Sample Expiration 09/25/2020 10/09/2020,2359   Final   Extend sample reason 09/25/2020    Final                   Value:NO TRANSFUSIONS OR PREGNANCY IN THE PAST 3 MONTHS Performed at [facility], [address], [place], [place] [address]      X-Rays:DG Pelvis Portable  Result Date: 10/08/2020 CLINICAL DATA:  Postop right hip EXAM: PORTABLE PELVIS 1-2 VIEWS COMPARISON:  Seventy fluoroscopic image FINDINGS: Interval postsurgical changes from right total hip arthroplasty. Arthroplasty components appear in their expected alignment. No periprosthetic fracture is identified. Expected postoperative changes within the overlying soft tissues. IMPRESSION: Expected postsurgical changes of right hip arthroplasty. Electronically Signed   By: Allegra Lai M.D.   On: 10/08/2020 10:32   DG C-Arm 1-60 Min-No Report  Result Date: 10/08/2020 Fluoroscopy was utilized by the requesting physician.  No radiographic interpretation.   DG HIP OPERATIVE UNILAT W OR W/O PELVIS RIGHT  Result  Date: 10/08/2020 CLINICAL DATA:  Hip replacement EXAM: OPERATIVE RIGHT HIP (WITH PELVIS IF PERFORMED) 2 VIEWS TECHNIQUE: Fluoroscopic spot image(s) were submitted for interpretation post-operatively. COMPARISON:  None. FINDINGS: Changes of right hip replacement. Normal AP alignment. No hardware bony complicating feature. IMPRESSION: Right hip replacement.  No visible complicating feature. Electronically Signed   By: Charlett Nose M.D.   On: 10/08/2020 12:38    EKG: Orders placed or performed during the hospital encounter of 09/25/20   EKG 12 lead per protocol   EKG 12 lead per protocol     Hospital Course: Tracy Lawson is a 70 y.o. who was admitted to [facility]. They were brought to the operating room on 10/08/2020 and underwent Procedure(s): TOTAL HIP ARTHROPLASTY ANTERIOR APPROACH.  Patient tolerated the procedure well and was later transferred to the recovery room and then to the orthopaedic floor for postoperative care. They were given PO and IV analgesics for pain control following their surgery. They were given 24 hours of postoperative antibiotics of  Anti-infectives (From admission, onward)    Start     Dose/Rate Route Frequency Ordered Stop   10/08/20 1400  ceFAZolin (ANCEF) IVPB 2g/100 mL premix        2 g 200 mL/hr over 30 Minutes Intravenous Every 6 hours 10/08/20 1256 10/08/20 2040   10/08/20 0600  ceFAZolin (ANCEF) IVPB 2g/100 mL premix        2 g 200 mL/hr over 30 Minutes Intravenous On call to O.R. 10/08/20 0522 10/08/20 0745      and started on DVT prophylaxis in the form of Aspirin.   PT and OT were ordered for total joint protocol. Discharge planning consulted to help with postop disposition and equipment needs.  Patient had an uneventful night on the evening of surgery. They started to get up OOB with therapy on POD#1. Pt was seen during rounds and was ready to go home pending progress with therapy. She worked with therapy on POD #1 and was meeting goals. Pt  was  discharged to home later that day in stable condition.  Diet: Regular diet Activity: WBAT Follow-up: in 2 weeks Disposition: Home Discharged Condition: good   Discharge Instructions     Call MD / Call 911   Complete by: As directed    If you experience chest pain or shortness of breath, CALL 911 and be transported to the hospital emergency room.  If you develope a fever above 101 F, pus (white drainage) or increased drainage or redness at the wound, or calf pain, call your surgeon's office.   Change dressing   Complete by: As directed    You have an adhesive waterproof bandage over the incision. Leave this in place until your first follow-up appointment. Once you remove this you will not need to place another bandage.   Constipation Prevention   Complete by: As directed    Drink plenty of fluids.  Prune juice may be helpful.  You may use a stool softener, such as Colace (over the counter) 100 mg twice a day.  Use MiraLax (over the counter) for constipation as needed.   Diet - low sodium heart healthy   Complete by: As directed    Do not sit on low chairs, stoools or toilet seats, as it may be difficult to get up from low surfaces   Complete by: As directed    Driving restrictions   Complete by: As directed    No driving for two weeks   Post-operative opioid taper instructions:   Complete by: As directed    POST-OPERATIVE OPIOID TAPER INSTRUCTIONS: It is important to wean off of your opioid medication as soon as possible. If you do not need pain medication after your surgery it is ok to stop day one. Opioids include: Codeine, Hydrocodone(Norco, Vicodin), Oxycodone(Percocet, oxycontin) and hydromorphone amongst others.  Long term and even short term use of opiods can cause: Increased pain response Dependence Constipation Depression Respiratory depression And more.  Withdrawal symptoms can include Flu like symptoms Nausea, vomiting And more Techniques to manage these  symptoms Hydrate well Eat regular healthy meals Stay active Use relaxation techniques(deep breathing, meditating, yoga) Do Not substitute Alcohol to help with tapering If you have been on opioids for less than two weeks and do not have pain than it is ok to stop all together.  Plan to wean off of opioids This plan should start within one week post op of your joint replacement. Maintain the same interval or time between taking each dose and first decrease the dose.  Cut the total daily intake of opioids by one tablet each day Next start to increase the time between doses. The last dose that should be eliminated is the evening dose.      TED hose   Complete by: As directed    Use stockings (TED hose) for three weeks on both leg(s).  You may remove them at night for sleeping.   Weight bearing as tolerated   Complete by: As directed       Allergies as of 10/09/2020   No Known Allergies      Medication List     STOP taking these medications    aspirin 81 MG tablet Replaced by: aspirin 325 MG EC tablet   meloxicam 15 MG tablet Commonly known as: MOBIC       TAKE these medications    amLODipine 10 MG tablet Commonly known as: NORVASC Take 10 mg by mouth daily.   aspirin 325 MG EC tablet Take  1 tablet (325 mg total) by mouth 2 (two) times daily. Take for 21 days. Then take one 81 mg aspirin once a day for 21 days. Then discontinue aspirin. Replaces: aspirin 81 MG tablet   atorvastatin 20 MG tablet Commonly known as: LIPITOR Take 20 mg by mouth daily.   Calcium Carbonate-Vitamin D3 600-400 MG-UNIT Tabs Take 1 tablet by mouth daily. Notes to patient: Resume home regimen   carbamazepine 300 MG 12 hr capsule Commonly known as: CARBATROL TAKE 1 CAPSULE BY MOUTH EVERY MORNING AND TAKE TWO CAPSULES BY MOUTH AT BEDTIME   cloNIDine 0.3 MG tablet Commonly known as: CATAPRES Take 0.3 mg by mouth 2 (two) times daily.   Cod Liver Oil 1000 MG Caps Take 1,000 mg by mouth  daily. Notes to patient: Resume home regimen   furosemide 40 MG tablet Commonly known as: LASIX Take 40 mg by mouth 2 (two) times daily.   HYDROcodone-acetaminophen 5-325 MG tablet Commonly known as: NORCO/VICODIN Take 1-2 tablets by mouth every 6 (six) hours as needed for severe pain. Notes to patient: Last dose given 09/26 12:46pm   labetalol 300 MG tablet Commonly known as: NORMODYNE Take 300 mg by mouth 2 (two) times daily.   levETIRAcetam 750 MG tablet Commonly known as: KEPPRA TAKE 1 TABLET BY MOUTH EVERY TWELVE HOURS What changed: See the new instructions.   methocarbamol 500 MG tablet Commonly known as: ROBAXIN Take 1 tablet (500 mg total) by mouth every 6 (six) hours as needed for muscle spasms. Notes to patient: Last dose given 09/29 12:46pm   Omega 3 500 500 MG Caps Take 1 capsule by mouth daily. Notes to patient: Resume home regimen   potassium chloride SA 20 MEQ tablet Commonly known as: KLOR-CON Take 20 mEq by mouth daily.   traMADol 50 MG tablet Commonly known as: ULTRAM Take 1-2 tablets (50-100 mg total) by mouth every 6 (six) hours as needed for moderate pain. Notes to patient: Last dose given 09/29 06:34am   Vitamin D (Ergocalciferol) 1.25 MG (50000 UNIT) Caps capsule Commonly known as: DRISDOL Take 50,000 Units by mouth every Monday. Notes to patient: Resume home regimen   zinc gluconate 50 MG tablet Take 50 mg by mouth 2 (two) times a week. Notes to patient: Resume home regimen               Discharge Care Instructions  (From admission, onward)           Start     Ordered   10/09/20 0000  Weight bearing as tolerated        10/09/20 0743   10/09/20 0000  Change dressing       Comments: You have an adhesive waterproof bandage over the incision. Leave this in place until your first follow-up appointment. Once you remove this you will not need to place another bandage.   10/09/20 1025            Follow-up Information      Alyssa Grove, PA-C. Go on 10/21/2020.   Specialty: Orthopedic Surgery Why: You are scheduled for a follow up appointment on 10-21-20 at 1:30 pm. Contact information: [address] [place] [address] [place] [address] [phone]                 Signed: Nelia Shi, MBA, PA-C Orthopedic Surgery 10/21/2020, 11:47 AM

## 2020-10-23 DIAGNOSIS — M6281 Muscle weakness (generalized): Secondary | ICD-10-CM | POA: Diagnosis not present

## 2020-10-23 DIAGNOSIS — Z471 Aftercare following joint replacement surgery: Secondary | ICD-10-CM | POA: Diagnosis not present

## 2020-10-23 DIAGNOSIS — M25551 Pain in right hip: Secondary | ICD-10-CM | POA: Diagnosis not present

## 2020-10-27 DIAGNOSIS — M6281 Muscle weakness (generalized): Secondary | ICD-10-CM | POA: Diagnosis not present

## 2020-10-27 DIAGNOSIS — M25551 Pain in right hip: Secondary | ICD-10-CM | POA: Diagnosis not present

## 2020-10-27 DIAGNOSIS — Z471 Aftercare following joint replacement surgery: Secondary | ICD-10-CM | POA: Diagnosis not present

## 2020-10-30 DIAGNOSIS — M25551 Pain in right hip: Secondary | ICD-10-CM | POA: Diagnosis not present

## 2020-10-30 DIAGNOSIS — M6281 Muscle weakness (generalized): Secondary | ICD-10-CM | POA: Diagnosis not present

## 2020-10-30 DIAGNOSIS — Z471 Aftercare following joint replacement surgery: Secondary | ICD-10-CM | POA: Diagnosis not present

## 2020-11-04 DIAGNOSIS — M6281 Muscle weakness (generalized): Secondary | ICD-10-CM | POA: Diagnosis not present

## 2020-11-04 DIAGNOSIS — Z471 Aftercare following joint replacement surgery: Secondary | ICD-10-CM | POA: Diagnosis not present

## 2020-11-04 DIAGNOSIS — M25551 Pain in right hip: Secondary | ICD-10-CM | POA: Diagnosis not present

## 2020-11-06 DIAGNOSIS — M25551 Pain in right hip: Secondary | ICD-10-CM | POA: Diagnosis not present

## 2020-11-06 DIAGNOSIS — M6281 Muscle weakness (generalized): Secondary | ICD-10-CM | POA: Diagnosis not present

## 2020-11-06 DIAGNOSIS — Z471 Aftercare following joint replacement surgery: Secondary | ICD-10-CM | POA: Diagnosis not present

## 2020-11-09 DIAGNOSIS — M6281 Muscle weakness (generalized): Secondary | ICD-10-CM | POA: Diagnosis not present

## 2020-11-09 DIAGNOSIS — M25551 Pain in right hip: Secondary | ICD-10-CM | POA: Diagnosis not present

## 2020-11-09 DIAGNOSIS — Z471 Aftercare following joint replacement surgery: Secondary | ICD-10-CM | POA: Diagnosis not present

## 2020-11-10 DIAGNOSIS — I10 Essential (primary) hypertension: Secondary | ICD-10-CM | POA: Diagnosis not present

## 2020-11-11 ENCOUNTER — Ambulatory Visit: Payer: TRICARE For Life (TFL) | Admitting: Podiatry

## 2020-11-11 DIAGNOSIS — M25551 Pain in right hip: Secondary | ICD-10-CM | POA: Diagnosis not present

## 2020-11-11 DIAGNOSIS — M25552 Pain in left hip: Secondary | ICD-10-CM | POA: Diagnosis not present

## 2020-11-11 DIAGNOSIS — Z471 Aftercare following joint replacement surgery: Secondary | ICD-10-CM | POA: Diagnosis not present

## 2020-11-17 DIAGNOSIS — Z471 Aftercare following joint replacement surgery: Secondary | ICD-10-CM | POA: Diagnosis not present

## 2020-11-17 DIAGNOSIS — M6281 Muscle weakness (generalized): Secondary | ICD-10-CM | POA: Diagnosis not present

## 2020-11-17 DIAGNOSIS — M25551 Pain in right hip: Secondary | ICD-10-CM | POA: Diagnosis not present

## 2020-11-20 DIAGNOSIS — M6281 Muscle weakness (generalized): Secondary | ICD-10-CM | POA: Diagnosis not present

## 2020-11-20 DIAGNOSIS — M25551 Pain in right hip: Secondary | ICD-10-CM | POA: Diagnosis not present

## 2020-11-20 DIAGNOSIS — Z471 Aftercare following joint replacement surgery: Secondary | ICD-10-CM | POA: Diagnosis not present

## 2020-11-21 ENCOUNTER — Ambulatory Visit: Payer: TRICARE For Life (TFL) | Admitting: Podiatry

## 2020-11-25 DIAGNOSIS — Z471 Aftercare following joint replacement surgery: Secondary | ICD-10-CM | POA: Diagnosis not present

## 2020-11-25 DIAGNOSIS — M25551 Pain in right hip: Secondary | ICD-10-CM | POA: Diagnosis not present

## 2020-11-25 DIAGNOSIS — M6281 Muscle weakness (generalized): Secondary | ICD-10-CM | POA: Diagnosis not present

## 2020-11-27 DIAGNOSIS — Z471 Aftercare following joint replacement surgery: Secondary | ICD-10-CM | POA: Diagnosis not present

## 2020-11-27 DIAGNOSIS — M6281 Muscle weakness (generalized): Secondary | ICD-10-CM | POA: Diagnosis not present

## 2020-11-27 DIAGNOSIS — M25551 Pain in right hip: Secondary | ICD-10-CM | POA: Diagnosis not present

## 2020-12-02 DIAGNOSIS — M25552 Pain in left hip: Secondary | ICD-10-CM | POA: Diagnosis not present

## 2020-12-10 DIAGNOSIS — I10 Essential (primary) hypertension: Secondary | ICD-10-CM | POA: Diagnosis not present

## 2020-12-16 DIAGNOSIS — M545 Low back pain, unspecified: Secondary | ICD-10-CM | POA: Diagnosis not present

## 2020-12-16 DIAGNOSIS — M25552 Pain in left hip: Secondary | ICD-10-CM | POA: Diagnosis not present

## 2020-12-19 ENCOUNTER — Other Ambulatory Visit: Payer: Self-pay

## 2020-12-19 ENCOUNTER — Ambulatory Visit (INDEPENDENT_AMBULATORY_CARE_PROVIDER_SITE_OTHER): Payer: Medicare PPO | Admitting: Podiatry

## 2020-12-19 DIAGNOSIS — M79671 Pain in right foot: Secondary | ICD-10-CM

## 2020-12-19 DIAGNOSIS — M7661 Achilles tendinitis, right leg: Secondary | ICD-10-CM

## 2020-12-19 NOTE — Progress Notes (Signed)
  Subjective:  Patient ID: Tracy Lawson, female    DOB: 09-10-1950,  MRN: [id number]  Chief Complaint  Patient presents with   Tendonitis      right Ankle swollen     70 y.o. female presents with the above complaint. History confirmed with patient. Not having much pain at all at her right ankle. Still swells occasionally. Denies new pedal issues.  Objective:  Physical Exam: warm, good capillary refill, no trophic changes or ulcerative lesions, normal DP and PT pulses, and normal sensory exam.  Right Foot: no tenderness posterior achilles without local warmth or edema. Assessment:   1. Tendonitis, Achilles, right   2. Pain in right foot     Plan:  Patient was evaluated and treated and all questions answered.  Achilles Tendonitis  -Appears resolved. No pain today. Good ROM. At this point no further intervention. Patient can f/u as needed or if issues recur.  No follow-ups on file.

## 2021-01-09 DIAGNOSIS — I10 Essential (primary) hypertension: Secondary | ICD-10-CM | POA: Diagnosis not present

## 2021-01-21 DIAGNOSIS — M5416 Radiculopathy, lumbar region: Secondary | ICD-10-CM | POA: Diagnosis not present

## 2021-02-08 DIAGNOSIS — I10 Essential (primary) hypertension: Secondary | ICD-10-CM | POA: Diagnosis not present

## 2021-02-09 DIAGNOSIS — R0981 Nasal congestion: Secondary | ICD-10-CM | POA: Diagnosis not present

## 2021-02-09 DIAGNOSIS — J019 Acute sinusitis, unspecified: Secondary | ICD-10-CM | POA: Diagnosis not present

## 2021-02-09 DIAGNOSIS — I10 Essential (primary) hypertension: Secondary | ICD-10-CM | POA: Diagnosis not present

## 2021-02-09 DIAGNOSIS — Z299 Encounter for prophylactic measures, unspecified: Secondary | ICD-10-CM | POA: Diagnosis not present

## 2021-02-13 DIAGNOSIS — M5416 Radiculopathy, lumbar region: Secondary | ICD-10-CM | POA: Diagnosis not present

## 2021-02-13 DIAGNOSIS — M25659 Stiffness of unspecified hip, not elsewhere classified: Secondary | ICD-10-CM | POA: Diagnosis not present

## 2021-02-16 DIAGNOSIS — M5416 Radiculopathy, lumbar region: Secondary | ICD-10-CM | POA: Diagnosis not present

## 2021-02-16 DIAGNOSIS — M25659 Stiffness of unspecified hip, not elsewhere classified: Secondary | ICD-10-CM | POA: Diagnosis not present

## 2021-02-18 DIAGNOSIS — M5416 Radiculopathy, lumbar region: Secondary | ICD-10-CM | POA: Diagnosis not present

## 2021-02-18 DIAGNOSIS — M25659 Stiffness of unspecified hip, not elsewhere classified: Secondary | ICD-10-CM | POA: Diagnosis not present

## 2021-02-23 DIAGNOSIS — M25659 Stiffness of unspecified hip, not elsewhere classified: Secondary | ICD-10-CM | POA: Diagnosis not present

## 2021-02-23 DIAGNOSIS — M5416 Radiculopathy, lumbar region: Secondary | ICD-10-CM | POA: Diagnosis not present

## 2021-02-25 DIAGNOSIS — M25659 Stiffness of unspecified hip, not elsewhere classified: Secondary | ICD-10-CM | POA: Diagnosis not present

## 2021-02-25 DIAGNOSIS — M5416 Radiculopathy, lumbar region: Secondary | ICD-10-CM | POA: Diagnosis not present

## 2021-03-03 DIAGNOSIS — M25659 Stiffness of unspecified hip, not elsewhere classified: Secondary | ICD-10-CM | POA: Diagnosis not present

## 2021-03-03 DIAGNOSIS — M5416 Radiculopathy, lumbar region: Secondary | ICD-10-CM | POA: Diagnosis not present

## 2021-03-04 DIAGNOSIS — M5416 Radiculopathy, lumbar region: Secondary | ICD-10-CM | POA: Diagnosis not present

## 2021-03-04 DIAGNOSIS — M25659 Stiffness of unspecified hip, not elsewhere classified: Secondary | ICD-10-CM | POA: Diagnosis not present

## 2021-03-10 DIAGNOSIS — I10 Essential (primary) hypertension: Secondary | ICD-10-CM | POA: Diagnosis not present

## 2021-03-10 DIAGNOSIS — M25659 Stiffness of unspecified hip, not elsewhere classified: Secondary | ICD-10-CM | POA: Diagnosis not present

## 2021-03-10 DIAGNOSIS — M5416 Radiculopathy, lumbar region: Secondary | ICD-10-CM | POA: Diagnosis not present

## 2021-03-11 DIAGNOSIS — M5416 Radiculopathy, lumbar region: Secondary | ICD-10-CM | POA: Diagnosis not present

## 2021-03-11 DIAGNOSIS — M25659 Stiffness of unspecified hip, not elsewhere classified: Secondary | ICD-10-CM | POA: Diagnosis not present

## 2021-03-18 DIAGNOSIS — M5416 Radiculopathy, lumbar region: Secondary | ICD-10-CM | POA: Diagnosis not present

## 2021-03-18 DIAGNOSIS — M25659 Stiffness of unspecified hip, not elsewhere classified: Secondary | ICD-10-CM | POA: Diagnosis not present

## 2021-03-26 DIAGNOSIS — M25659 Stiffness of unspecified hip, not elsewhere classified: Secondary | ICD-10-CM | POA: Diagnosis not present

## 2021-03-26 DIAGNOSIS — M5416 Radiculopathy, lumbar region: Secondary | ICD-10-CM | POA: Diagnosis not present

## 2021-04-03 DIAGNOSIS — Z87891 Personal history of nicotine dependence: Secondary | ICD-10-CM | POA: Diagnosis not present

## 2021-04-03 DIAGNOSIS — I10 Essential (primary) hypertension: Secondary | ICD-10-CM | POA: Diagnosis not present

## 2021-04-03 DIAGNOSIS — Z6831 Body mass index (BMI) 31.0-31.9, adult: Secondary | ICD-10-CM | POA: Diagnosis not present

## 2021-04-03 DIAGNOSIS — Z299 Encounter for prophylactic measures, unspecified: Secondary | ICD-10-CM | POA: Diagnosis not present

## 2021-04-03 DIAGNOSIS — Z713 Dietary counseling and surveillance: Secondary | ICD-10-CM | POA: Diagnosis not present

## 2021-04-09 DIAGNOSIS — I10 Essential (primary) hypertension: Secondary | ICD-10-CM | POA: Diagnosis not present

## 2021-04-16 DIAGNOSIS — Z1231 Encounter for screening mammogram for malignant neoplasm of breast: Secondary | ICD-10-CM | POA: Diagnosis not present

## 2021-05-05 DIAGNOSIS — M7731 Calcaneal spur, right foot: Secondary | ICD-10-CM | POA: Diagnosis not present

## 2021-05-05 DIAGNOSIS — M7661 Achilles tendinitis, right leg: Secondary | ICD-10-CM | POA: Diagnosis not present

## 2021-05-05 DIAGNOSIS — M79671 Pain in right foot: Secondary | ICD-10-CM | POA: Diagnosis not present

## 2021-05-05 DIAGNOSIS — M71571 Other bursitis, not elsewhere classified, right ankle and foot: Secondary | ICD-10-CM | POA: Diagnosis not present

## 2021-05-10 DIAGNOSIS — I10 Essential (primary) hypertension: Secondary | ICD-10-CM | POA: Diagnosis not present

## 2021-05-13 DIAGNOSIS — M5416 Radiculopathy, lumbar region: Secondary | ICD-10-CM | POA: Diagnosis not present

## 2021-05-14 ENCOUNTER — Telehealth: Payer: Self-pay | Admitting: Adult Health

## 2021-05-14 ENCOUNTER — Encounter: Payer: Self-pay | Admitting: Adult Health

## 2021-05-14 NOTE — Telephone Encounter (Signed)
LVM and sent cx letter informing pt of r/s needed for 6/7 appt- NP Megan out. ?

## 2021-05-15 DIAGNOSIS — N6012 Diffuse cystic mastopathy of left breast: Secondary | ICD-10-CM | POA: Diagnosis not present

## 2021-05-15 DIAGNOSIS — R922 Inconclusive mammogram: Secondary | ICD-10-CM | POA: Diagnosis not present

## 2021-05-15 DIAGNOSIS — N6011 Diffuse cystic mastopathy of right breast: Secondary | ICD-10-CM | POA: Diagnosis not present

## 2021-05-15 DIAGNOSIS — R928 Other abnormal and inconclusive findings on diagnostic imaging of breast: Secondary | ICD-10-CM | POA: Diagnosis not present

## 2021-05-15 DIAGNOSIS — N6459 Other signs and symptoms in breast: Secondary | ICD-10-CM | POA: Diagnosis not present

## 2021-05-20 DIAGNOSIS — M7661 Achilles tendinitis, right leg: Secondary | ICD-10-CM | POA: Diagnosis not present

## 2021-05-20 DIAGNOSIS — M71571 Other bursitis, not elsewhere classified, right ankle and foot: Secondary | ICD-10-CM | POA: Diagnosis not present

## 2021-05-29 DIAGNOSIS — M5416 Radiculopathy, lumbar region: Secondary | ICD-10-CM | POA: Diagnosis not present

## 2021-05-29 DIAGNOSIS — M48062 Spinal stenosis, lumbar region with neurogenic claudication: Secondary | ICD-10-CM | POA: Diagnosis not present

## 2021-06-01 DIAGNOSIS — M5416 Radiculopathy, lumbar region: Secondary | ICD-10-CM | POA: Diagnosis not present

## 2021-06-03 DIAGNOSIS — M7661 Achilles tendinitis, right leg: Secondary | ICD-10-CM | POA: Diagnosis not present

## 2021-06-03 DIAGNOSIS — M71571 Other bursitis, not elsewhere classified, right ankle and foot: Secondary | ICD-10-CM | POA: Diagnosis not present

## 2021-06-09 DIAGNOSIS — I10 Essential (primary) hypertension: Secondary | ICD-10-CM | POA: Diagnosis not present

## 2021-06-10 DIAGNOSIS — M5416 Radiculopathy, lumbar region: Secondary | ICD-10-CM | POA: Diagnosis not present

## 2021-06-10 DIAGNOSIS — M6281 Muscle weakness (generalized): Secondary | ICD-10-CM | POA: Diagnosis not present

## 2021-06-12 DIAGNOSIS — M9903 Segmental and somatic dysfunction of lumbar region: Secondary | ICD-10-CM | POA: Diagnosis not present

## 2021-06-12 DIAGNOSIS — M48061 Spinal stenosis, lumbar region without neurogenic claudication: Secondary | ICD-10-CM | POA: Diagnosis not present

## 2021-06-12 DIAGNOSIS — M5442 Lumbago with sciatica, left side: Secondary | ICD-10-CM | POA: Diagnosis not present

## 2021-06-15 DIAGNOSIS — M9903 Segmental and somatic dysfunction of lumbar region: Secondary | ICD-10-CM | POA: Diagnosis not present

## 2021-06-15 DIAGNOSIS — M5416 Radiculopathy, lumbar region: Secondary | ICD-10-CM | POA: Diagnosis not present

## 2021-06-15 DIAGNOSIS — M5442 Lumbago with sciatica, left side: Secondary | ICD-10-CM | POA: Diagnosis not present

## 2021-06-15 DIAGNOSIS — M48061 Spinal stenosis, lumbar region without neurogenic claudication: Secondary | ICD-10-CM | POA: Diagnosis not present

## 2021-06-15 DIAGNOSIS — M25659 Stiffness of unspecified hip, not elsewhere classified: Secondary | ICD-10-CM | POA: Diagnosis not present

## 2021-06-17 ENCOUNTER — Ambulatory Visit: Payer: Medicare PPO | Admitting: Adult Health

## 2021-06-18 DIAGNOSIS — M5416 Radiculopathy, lumbar region: Secondary | ICD-10-CM | POA: Diagnosis not present

## 2021-06-18 DIAGNOSIS — M9903 Segmental and somatic dysfunction of lumbar region: Secondary | ICD-10-CM | POA: Diagnosis not present

## 2021-06-18 DIAGNOSIS — M5442 Lumbago with sciatica, left side: Secondary | ICD-10-CM | POA: Diagnosis not present

## 2021-06-18 DIAGNOSIS — M25659 Stiffness of unspecified hip, not elsewhere classified: Secondary | ICD-10-CM | POA: Diagnosis not present

## 2021-06-18 DIAGNOSIS — M48061 Spinal stenosis, lumbar region without neurogenic claudication: Secondary | ICD-10-CM | POA: Diagnosis not present

## 2021-06-23 DIAGNOSIS — M5442 Lumbago with sciatica, left side: Secondary | ICD-10-CM | POA: Diagnosis not present

## 2021-06-23 DIAGNOSIS — M9903 Segmental and somatic dysfunction of lumbar region: Secondary | ICD-10-CM | POA: Diagnosis not present

## 2021-06-23 DIAGNOSIS — M5416 Radiculopathy, lumbar region: Secondary | ICD-10-CM | POA: Diagnosis not present

## 2021-06-23 DIAGNOSIS — M25659 Stiffness of unspecified hip, not elsewhere classified: Secondary | ICD-10-CM | POA: Diagnosis not present

## 2021-06-23 DIAGNOSIS — M48061 Spinal stenosis, lumbar region without neurogenic claudication: Secondary | ICD-10-CM | POA: Diagnosis not present

## 2021-06-24 DIAGNOSIS — M5416 Radiculopathy, lumbar region: Secondary | ICD-10-CM | POA: Diagnosis not present

## 2021-06-24 DIAGNOSIS — M25659 Stiffness of unspecified hip, not elsewhere classified: Secondary | ICD-10-CM | POA: Diagnosis not present

## 2021-06-25 DIAGNOSIS — M5442 Lumbago with sciatica, left side: Secondary | ICD-10-CM | POA: Diagnosis not present

## 2021-06-25 DIAGNOSIS — M48061 Spinal stenosis, lumbar region without neurogenic claudication: Secondary | ICD-10-CM | POA: Diagnosis not present

## 2021-06-25 DIAGNOSIS — M9903 Segmental and somatic dysfunction of lumbar region: Secondary | ICD-10-CM | POA: Diagnosis not present

## 2021-07-01 DIAGNOSIS — M5442 Lumbago with sciatica, left side: Secondary | ICD-10-CM | POA: Diagnosis not present

## 2021-07-01 DIAGNOSIS — M48061 Spinal stenosis, lumbar region without neurogenic claudication: Secondary | ICD-10-CM | POA: Diagnosis not present

## 2021-07-01 DIAGNOSIS — M9903 Segmental and somatic dysfunction of lumbar region: Secondary | ICD-10-CM | POA: Diagnosis not present

## 2021-07-01 DIAGNOSIS — M25659 Stiffness of unspecified hip, not elsewhere classified: Secondary | ICD-10-CM | POA: Diagnosis not present

## 2021-07-01 DIAGNOSIS — M5416 Radiculopathy, lumbar region: Secondary | ICD-10-CM | POA: Diagnosis not present

## 2021-07-02 DIAGNOSIS — M5416 Radiculopathy, lumbar region: Secondary | ICD-10-CM | POA: Diagnosis not present

## 2021-07-02 DIAGNOSIS — M9903 Segmental and somatic dysfunction of lumbar region: Secondary | ICD-10-CM | POA: Diagnosis not present

## 2021-07-02 DIAGNOSIS — M5442 Lumbago with sciatica, left side: Secondary | ICD-10-CM | POA: Diagnosis not present

## 2021-07-02 DIAGNOSIS — M25659 Stiffness of unspecified hip, not elsewhere classified: Secondary | ICD-10-CM | POA: Diagnosis not present

## 2021-07-02 DIAGNOSIS — M48061 Spinal stenosis, lumbar region without neurogenic claudication: Secondary | ICD-10-CM | POA: Diagnosis not present

## 2021-07-02 DIAGNOSIS — M6281 Muscle weakness (generalized): Secondary | ICD-10-CM | POA: Diagnosis not present

## 2021-07-08 DIAGNOSIS — I10 Essential (primary) hypertension: Secondary | ICD-10-CM | POA: Diagnosis not present

## 2021-07-08 DIAGNOSIS — E7800 Pure hypercholesterolemia, unspecified: Secondary | ICD-10-CM | POA: Diagnosis not present

## 2021-07-08 DIAGNOSIS — Z6831 Body mass index (BMI) 31.0-31.9, adult: Secondary | ICD-10-CM | POA: Diagnosis not present

## 2021-07-08 DIAGNOSIS — Z299 Encounter for prophylactic measures, unspecified: Secondary | ICD-10-CM | POA: Diagnosis not present

## 2021-07-08 DIAGNOSIS — R569 Unspecified convulsions: Secondary | ICD-10-CM | POA: Diagnosis not present

## 2021-07-09 DIAGNOSIS — M5416 Radiculopathy, lumbar region: Secondary | ICD-10-CM | POA: Diagnosis not present

## 2021-07-09 DIAGNOSIS — I10 Essential (primary) hypertension: Secondary | ICD-10-CM | POA: Diagnosis not present

## 2021-07-09 DIAGNOSIS — M25659 Stiffness of unspecified hip, not elsewhere classified: Secondary | ICD-10-CM | POA: Diagnosis not present

## 2021-07-10 DIAGNOSIS — E7800 Pure hypercholesterolemia, unspecified: Secondary | ICD-10-CM | POA: Diagnosis not present

## 2021-07-10 DIAGNOSIS — M9903 Segmental and somatic dysfunction of lumbar region: Secondary | ICD-10-CM | POA: Diagnosis not present

## 2021-07-10 DIAGNOSIS — M48061 Spinal stenosis, lumbar region without neurogenic claudication: Secondary | ICD-10-CM | POA: Diagnosis not present

## 2021-07-10 DIAGNOSIS — M5442 Lumbago with sciatica, left side: Secondary | ICD-10-CM | POA: Diagnosis not present

## 2021-07-10 DIAGNOSIS — I10 Essential (primary) hypertension: Secondary | ICD-10-CM | POA: Diagnosis not present

## 2021-07-13 DIAGNOSIS — M5451 Vertebrogenic low back pain: Secondary | ICD-10-CM | POA: Diagnosis not present

## 2021-07-17 DIAGNOSIS — I10 Essential (primary) hypertension: Secondary | ICD-10-CM | POA: Diagnosis not present

## 2021-07-17 DIAGNOSIS — M9903 Segmental and somatic dysfunction of lumbar region: Secondary | ICD-10-CM | POA: Diagnosis not present

## 2021-07-17 DIAGNOSIS — M5442 Lumbago with sciatica, left side: Secondary | ICD-10-CM | POA: Diagnosis not present

## 2021-07-17 DIAGNOSIS — M48061 Spinal stenosis, lumbar region without neurogenic claudication: Secondary | ICD-10-CM | POA: Diagnosis not present

## 2021-07-17 DIAGNOSIS — Z299 Encounter for prophylactic measures, unspecified: Secondary | ICD-10-CM | POA: Diagnosis not present

## 2021-07-17 DIAGNOSIS — Z789 Other specified health status: Secondary | ICD-10-CM | POA: Diagnosis not present

## 2021-07-22 DIAGNOSIS — Z01419 Encounter for gynecological examination (general) (routine) without abnormal findings: Secondary | ICD-10-CM | POA: Diagnosis not present

## 2021-07-22 DIAGNOSIS — Z780 Asymptomatic menopausal state: Secondary | ICD-10-CM | POA: Diagnosis not present

## 2021-07-23 DIAGNOSIS — M48061 Spinal stenosis, lumbar region without neurogenic claudication: Secondary | ICD-10-CM | POA: Diagnosis not present

## 2021-07-23 DIAGNOSIS — M5442 Lumbago with sciatica, left side: Secondary | ICD-10-CM | POA: Diagnosis not present

## 2021-07-23 DIAGNOSIS — M9903 Segmental and somatic dysfunction of lumbar region: Secondary | ICD-10-CM | POA: Diagnosis not present

## 2021-07-30 DIAGNOSIS — M9903 Segmental and somatic dysfunction of lumbar region: Secondary | ICD-10-CM | POA: Diagnosis not present

## 2021-07-30 DIAGNOSIS — M48061 Spinal stenosis, lumbar region without neurogenic claudication: Secondary | ICD-10-CM | POA: Diagnosis not present

## 2021-07-30 DIAGNOSIS — M5442 Lumbago with sciatica, left side: Secondary | ICD-10-CM | POA: Diagnosis not present

## 2021-08-04 ENCOUNTER — Telehealth: Payer: Self-pay | Admitting: Adult Health

## 2021-08-04 DIAGNOSIS — G40209 Localization-related (focal) (partial) symptomatic epilepsy and epileptic syndromes with complex partial seizures, not intractable, without status epilepticus: Secondary | ICD-10-CM

## 2021-08-04 MED ORDER — CARBAMAZEPINE ER 300 MG PO CP12: ORAL_CAPSULE | ORAL | 0 refills | Status: DC

## 2021-08-04 NOTE — Telephone Encounter (Signed)
Pt is requesting refill on carbamazepine (CARBATROL) 300 MG 12 hr capsule. Pt would like for prescription to be sent to [facility] [address] [place], [place], [place] [address]

## 2021-08-04 NOTE — Telephone Encounter (Signed)
Refill x 1 sent to [facility]. Pt has an appt next month.

## 2021-08-06 DIAGNOSIS — M48061 Spinal stenosis, lumbar region without neurogenic claudication: Secondary | ICD-10-CM | POA: Diagnosis not present

## 2021-08-06 DIAGNOSIS — M5442 Lumbago with sciatica, left side: Secondary | ICD-10-CM | POA: Diagnosis not present

## 2021-08-06 DIAGNOSIS — M9903 Segmental and somatic dysfunction of lumbar region: Secondary | ICD-10-CM | POA: Diagnosis not present

## 2021-08-10 DIAGNOSIS — I10 Essential (primary) hypertension: Secondary | ICD-10-CM | POA: Diagnosis not present

## 2021-08-18 DIAGNOSIS — H40021 Open angle with borderline findings, high risk, right eye: Secondary | ICD-10-CM | POA: Diagnosis not present

## 2021-08-18 DIAGNOSIS — H25813 Combined forms of age-related cataract, bilateral: Secondary | ICD-10-CM | POA: Diagnosis not present

## 2021-08-18 DIAGNOSIS — H401123 Primary open-angle glaucoma, left eye, severe stage: Secondary | ICD-10-CM | POA: Diagnosis not present

## 2021-08-19 ENCOUNTER — Ambulatory Visit (INDEPENDENT_AMBULATORY_CARE_PROVIDER_SITE_OTHER): Payer: Medicare PPO | Admitting: Adult Health

## 2021-08-19 VITALS — BP 212/92 | HR 57 | Ht 65.5 in | Wt 188.0 lb

## 2021-08-19 DIAGNOSIS — Z5181 Encounter for therapeutic drug level monitoring: Secondary | ICD-10-CM | POA: Diagnosis not present

## 2021-08-19 DIAGNOSIS — G40209 Localization-related (focal) (partial) symptomatic epilepsy and epileptic syndromes with complex partial seizures, not intractable, without status epilepticus: Secondary | ICD-10-CM

## 2021-08-19 NOTE — Patient Instructions (Signed)
Your Plan:  Continue Keppra and Carbamazpine Blood work today If your symptoms worsen or you develop new symptoms please let us know.    Thank you for coming to see [place] at [facility]. I hope we have been able to provide you high quality care today.  You may receive a patient satisfaction survey over the next few weeks. We would appreciate your feedback and comments so that we may continue to improve ourselves and the health of our patients.

## 2021-08-19 NOTE — Progress Notes (Signed)
PATIENT: Tracy Lawson DOB: 1950/06/15  REASON FOR VISIT: follow up HISTORY FROM: patient PRIMARY NEUROLOGIST: [name]  Chief Complaint  Patient presents with   [id number]    Patient is here alone for yearly seizure follow-up. She denies any seizures since last visit. She denies any difficulty remembering to take her medication.     HISTORY OF PRESENT ILLNESS: Today 08/19/21:  Tracy Lawson  is a 71 year old female with a history of seizures. She returns today for follow-up. She remains on Keppra 750 mg twice a day and carbamazepine 300 mg in the morning and 600 mg in the evening. No seizure events. Continues to drive. Able to complete ADLS independently. Didn't take clonidine because it makes her sleepy. Will take when she gets back home. Checks her BP at home and this morning it was 158/81. Not symptomatic today.  06/16/20: Tracy Lawson is a 71 year old female with a history of seizures.  She returns today for follow-up.  She remains on Keppra 750 mg twice a day and carbamazepine 300 mg in the morning and 600 mg in the evening.  She denies any seizure events.  Reports that she tolerates the medication well.  She operates a Librarian, academic without difficulty.  Blood pressure is elevated today she reports that she did not take her medication as it makes her drowsy.  She plans to take her medication as soon as she gets home.  05/14/19: Tracy Lawson is a 71 year old female with a history of seizures.  She returns today for follow-up.  She remains on Keppra and carbamazepine.  She denies any seizure events.  Denies any changes with her gait or balance.  She operates a Librarian, academic.  She is able to complete all ADLs independently.  She lives at home with her spouse and 2 children.  She returns today for an evaluation.  HISTORY HPI: Is a long established patient of [name].   She was last seen in the office by my colleague, [name] on 07-27-12 after Dr. Imagene Gurney retirement. At the time of her  presentation she had a fairly high blood pressure of 181/87 mmHg with a low pulse rate of 50. The patient is on clonidine,  but her main neurologic complaint is a long-standing seizure disorder which has been well controlled with medications. She is unaccompanied today as the seizures were described as simple partial at onset with some complex secondary generalization. Her first seizure was in 1993. The seizure occurred right after the birth of her second child, at 50 months gestational age. The pregnancy was complicated by toxemia and PRES. Initially treated with phenytoin she developed gum swelling and was switched to carbamazepine under which he become very sleepy, ataxic unsteady in her gait. They were  seizures while  on the medication, but she describes as pronounced by myoclonic twitches and speech arrest. Under her current regimen of Keppra  and carbamazepine,  she feels she's doing very well, she does however have a history of poorly controlled hypertension in the past. No aura , diplopia and no known hyponatremia.  She has no new complaints today. She denies any recent illness, aura, seizure, problems with her medication or side effects. There is a new entry to her family history , her sister died of a Stroke in decemeber 200[address] , at age 92, her younger sister had a stroke, but is cared for at home.   REVIEW OF SYSTEMS: Out of a complete 14 system review of symptoms,  the patient complains only of the following symptoms, and all other reviewed systems are negative.  See HPI  ALLERGIES: No Known Allergies  HOME MEDICATIONS: Outpatient Medications Prior to Visit  Medication Sig Dispense Refill   amLODipine (NORVASC) 10 MG tablet Take 10 mg by mouth daily.     aspirin EC 81 MG tablet Take 81 mg by mouth daily. Swallow whole.     atorvastatin (LIPITOR) 20 MG tablet Take 20 mg by mouth daily.     Calcium Carbonate-Vitamin D3 600-400 MG-UNIT TABS Take 1 tablet by mouth daily.     carbamazepine  (CARBATROL) 300 MG 12 hr capsule TAKE 1 CAPSULE BY MOUTH EVERY MORNING AND TAKE TWO CAPSULES BY MOUTH AT BEDTIME 270 capsule 0   cloNIDine (CATAPRES) 0.3 MG tablet Take 0.3 mg by mouth 2 (two) times daily.     Cod Liver Oil 1000 MG CAPS Take 1,000 mg by mouth daily.     furosemide (LASIX) 40 MG tablet Take 40 mg by mouth 2 (two) times daily.      labetalol (NORMODYNE) 200 MG tablet Take 200 mg by mouth 2 (two) times daily.     levETIRAcetam (KEPPRA) 750 MG tablet TAKE 1 TABLET BY MOUTH EVERY TWELVE HOURS (Patient taking differently: Take 750 mg by mouth 2 (two) times daily.) 180 tablet 3   Omega-3 Fatty Acids (OMEGA 3 500) 500 MG CAPS Take 1 capsule by mouth daily.     potassium chloride SA (K-DUR,KLOR-CON) 20 MEQ tablet Take 20 mEq by mouth daily.     Vitamin D, Ergocalciferol, (DRISDOL) 1.25 MG (50000 UNIT) CAPS capsule Take 50,000 Units by mouth every Monday.     zinc gluconate 50 MG tablet Take 50 mg by mouth daily.     aspirin EC 325 MG EC tablet Take 1 tablet (325 mg total) by mouth 2 (two) times daily. Take for 21 days. Then take one 81 mg aspirin once a day for 21 days. Then discontinue aspirin. (Patient not taking: Reported on 08/19/2021) 42 tablet 0   HYDROcodone-acetaminophen (NORCO/VICODIN) 5-325 MG tablet Take 1-2 tablets by mouth every 6 (six) hours as needed for severe pain. (Patient not taking: Reported on 08/19/2021) 42 tablet 0   methocarbamol (ROBAXIN) 500 MG tablet Take 1 tablet (500 mg total) by mouth every 6 (six) hours as needed for muscle spasms. (Patient not taking: Reported on 08/19/2021) 40 tablet 0   traMADol (ULTRAM) 50 MG tablet Take 1-2 tablets (50-100 mg total) by mouth every 6 (six) hours as needed for moderate pain. (Patient not taking: Reported on 08/19/2021) 40 tablet 0   labetalol (NORMODYNE) 300 MG tablet Take 300 mg by mouth 2 (two) times daily. (Patient not taking: Reported on 08/19/2021)     No facility-administered medications prior to visit.    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, without mention of intractable epilepsy    Pain in limb    Seizures (HCC)    Sleep disturbance, unspecified    Unspecified essential hypertension     PAST SURGICAL HISTORY: Past Surgical History:  Procedure Laterality Date   CESAREAN SECTION     x1   NO PAST SURGERIES     TOTAL HIP ARTHROPLASTY Right 10/08/2020   Procedure: TOTAL HIP ARTHROPLASTY ANTERIOR APPROACH;  Surgeon: Ollen Gross, MD;  Location: [facility];  Service: Orthopedics;  Laterality: Right;    FAMILY HISTORY: Family  History  Problem Relation Age of Onset   Stroke Father    Heart failure Mother    Stroke Sister     SOCIAL HISTORY: Social History   Socioeconomic History   Marital status: Married    Spouse name: Chrissie Noa   Number of children: 2   Years of education: 14   Highest education level: Not on file  Occupational History   Not on file  Tobacco Use   Smoking status: Former    Types: Cigarettes    Quit date: 07/27/1992    Years since quitting: 29.0   Smokeless tobacco: Never  Vaping Use   Vaping Use: Never used  Substance and Sexual Activity   Alcohol use: No   Drug use: No   Sexual activity: Not Currently    Birth control/protection: None  Other Topics Concern   Not on file  Social History Narrative   Patient is married Chrissie Noa) and lives with her husband, foster children and adopted children.   Patient is retired.   Patient has a college education.   Patient is right-handed.   Patient does not drink any caffeine.   Patient has two children.   Social Determinants of Health   Financial Resource Strain: Not on file  Food Insecurity: Not on file  Transportation Needs: Not on file  Physical Activity: Not on file  Stress: Not on file  Social Connections: Not on file  Intimate Partner Violence: Not on file       PHYSICAL EXAM  Vitals:   08/19/21 1316 08/19/21 1318  BP: (!) 212/90 (!) 205/91  Pulse: (!) 57 (!) 57  Weight: 188 lb (85.3 kg)   Height: 5' 5.5" (1.664 m)    Body mass index is 30.81 kg/m.  Generalized: Well developed, in no acute distress   Neurological examination  Mentation: Alert oriented to time, place, history taking. Follows all commands speech and language fluent Cranial nerve II-XII: Pupils were equal round reactive to light. Extraocular movements were full, visual field were full on confrontational test. Head turning and shoulder shrug  were normal and symmetric. Motor: The motor testing reveals 5 over 5 strength of all 4 extremities. Good symmetric motor tone is noted throughout.  Sensory: Sensory testing is intact to soft touch on all 4 extremities. No evidence of extinction is noted.  Coordination: Cerebellar testing reveals good finger-nose-finger and heel-to-shin bilaterally.  Gait and station: Gait is normal.  Reflexes: Deep tendon reflexes are symmetric and normal bilaterally.   DIAGNOSTIC DATA (LABS, IMAGING, TESTING) - I reviewed patient records, labs, notes, testing and imaging myself where available.  Lab Results  Component Value Date   WBC 8.3 10/09/2020   HGB 11.1 (L) 10/09/2020   HCT 32.7 (L) 10/09/2020   MCV 88.1 10/09/2020   PLT 170 10/09/2020      Component Value Date/Time   NA 135 10/09/2020 0316   NA 142 06/16/2020 1014   K 3.2 (L) 10/09/2020 0316   CL 105 10/09/2020 0316   CO2 25 10/09/2020 0316   GLUCOSE 128 (H) 10/09/2020 0316   BUN 16 10/09/2020 0316   BUN 17 06/16/2020 1014   CREATININE 0.76 10/09/2020 0316   CALCIUM 8.3 (L) 10/09/2020 0316   PROT 7.8 09/25/2020 1158   PROT 7.8 06/16/2020 1014   ALBUMIN 3.9 09/25/2020 1158   ALBUMIN 4.4 06/16/2020 1014   AST 24 09/25/2020 1158   ALT 18 09/25/2020 1158   ALKPHOS 105 09/25/2020 1158   BILITOT 0.6 09/25/2020 1158  BILITOT 0.2 06/16/2020 1014   GFRNONAA >60 10/09/2020  0316   GFRAA 88 05/14/2019 0954      ASSESSMENT AND PLAN 71 y.o. year old female  has a past medical history of Arthritis, Localization-related (focal) (partial) epilepsy and epileptic syndromes with complex partial seizures, without mention of intractable epilepsy, Pain in limb, Seizures (HCC), Sleep disturbance, unspecified, and Unspecified essential hypertension. here with:  1.  Seizures  -Continue carbamazepine 300 mg in a.m. and 600 mg at bedtime -Continue Keppra 750 mg twice a day -Blood work today -Blood pressure is elevated today was elevated when she came last visit. she has not taken her morning medication.  Advised that she should go home and take her medication and keep a check on her blood pressure.  If it remains elevated she should go see her primary care. Reports that she checked it this morning and it was 158/81 -Advised if she has any seizure event she should let us know -Follow-up in 1 year or sooner if needed    Butch Penny, MSN, NP-C 08/19/2021, 1:19 PM [facility] [address], [address] [place], [place] [address] [phone]

## 2021-08-21 DIAGNOSIS — M1612 Unilateral primary osteoarthritis, left hip: Secondary | ICD-10-CM | POA: Diagnosis not present

## 2021-08-21 DIAGNOSIS — Z96641 Presence of right artificial hip joint: Secondary | ICD-10-CM | POA: Diagnosis not present

## 2021-08-22 LAB — CBC WITH DIFFERENTIAL/PLATELET
Basophils Absolute: 0 10*3/uL (ref 0.0–0.2)
Basos: 0 %
EOS (ABSOLUTE): 0 10*3/uL (ref 0.0–0.4)
Eos: 0 %
Hematocrit: 35.9 % (ref 34.0–46.6)
Hemoglobin: 12.3 g/dL (ref 11.1–15.9)
Immature Grans (Abs): 0 10*3/uL (ref 0.0–0.1)
Immature Granulocytes: 0 %
Lymphocytes Absolute: 1.3 10*3/uL (ref 0.7–3.1)
Lymphs: 34 %
MCH: 30.2 pg (ref 26.6–33.0)
MCHC: 34.3 g/dL (ref 31.5–35.7)
MCV: 88 fL (ref 79–97)
Monocytes Absolute: 0.4 10*3/uL (ref 0.1–0.9)
Monocytes: 10 %
Neutrophils Absolute: 2.1 10*3/uL (ref 1.4–7.0)
Neutrophils: 56 %
Platelets: 214 10*3/uL (ref 150–450)
RBC: 4.07 x10E6/uL (ref 3.77–5.28)
RDW: 13 % (ref 11.7–15.4)
WBC: 3.8 10*3/uL (ref 3.4–10.8)

## 2021-08-22 LAB — COMPREHENSIVE METABOLIC PANEL
ALT: 15 IU/L (ref 0–32)
AST: 24 IU/L (ref 0–40)
Albumin/Globulin Ratio: 1.2 (ref 1.2–2.2)
Albumin: 4 g/dL (ref 3.9–4.9)
Alkaline Phosphatase: 118 IU/L (ref 44–121)
BUN/Creatinine Ratio: 17 (ref 12–28)
BUN: 13 mg/dL (ref 8–27)
Bilirubin Total: <0.2 mg/dL (ref 0.0–1.2)
CO2: 25 mmol/L (ref 20–29)
Calcium: 9.4 mg/dL (ref 8.7–10.3)
Chloride: 103 mmol/L (ref 96–106)
Creatinine, Ser: 0.78 mg/dL (ref 0.57–1.00)
Globulin, Total: 3.3 g/dL (ref 1.5–4.5)
Glucose: 105 mg/dL — ABNORMAL HIGH (ref 70–99)
Potassium: 4.2 mmol/L (ref 3.5–5.2)
Sodium: 141 mmol/L (ref 134–144)
Total Protein: 7.3 g/dL (ref 6.0–8.5)
eGFR: 82 mL/min/{1.73_m2} (ref >59)

## 2021-08-22 LAB — CARBAMAZEPINE LEVEL, TOTAL: Carbamazepine (Tegretol), S: 9.7 ug/mL (ref 4.0–12.0)

## 2021-08-24 ENCOUNTER — Telehealth: Payer: Self-pay | Admitting: *Deleted

## 2021-08-24 NOTE — Telephone Encounter (Signed)
-----   Message from Butch Penny, NP sent at 08/24/2021 12:11 PM EDT ----- Blood work unremarkable.

## 2021-08-24 NOTE — Telephone Encounter (Signed)
Called pt & LVM (ok per DPR) advising blood work unremarkable, no concerns noted. Left office number for call back if any questions.

## 2021-08-25 DIAGNOSIS — M48061 Spinal stenosis, lumbar region without neurogenic claudication: Secondary | ICD-10-CM | POA: Diagnosis not present

## 2021-08-25 DIAGNOSIS — M9903 Segmental and somatic dysfunction of lumbar region: Secondary | ICD-10-CM | POA: Diagnosis not present

## 2021-08-25 DIAGNOSIS — M5442 Lumbago with sciatica, left side: Secondary | ICD-10-CM | POA: Diagnosis not present

## 2021-09-03 ENCOUNTER — Other Ambulatory Visit: Payer: Self-pay | Admitting: Adult Health

## 2021-09-03 DIAGNOSIS — G40209 Localization-related (focal) (partial) symptomatic epilepsy and epileptic syndromes with complex partial seizures, not intractable, without status epilepticus: Secondary | ICD-10-CM

## 2021-09-08 DIAGNOSIS — M48061 Spinal stenosis, lumbar region without neurogenic claudication: Secondary | ICD-10-CM | POA: Diagnosis not present

## 2021-09-08 DIAGNOSIS — M5442 Lumbago with sciatica, left side: Secondary | ICD-10-CM | POA: Diagnosis not present

## 2021-09-08 DIAGNOSIS — M9903 Segmental and somatic dysfunction of lumbar region: Secondary | ICD-10-CM | POA: Diagnosis not present

## 2021-09-09 DIAGNOSIS — I10 Essential (primary) hypertension: Secondary | ICD-10-CM | POA: Diagnosis not present

## 2021-09-17 DIAGNOSIS — M71571 Other bursitis, not elsewhere classified, right ankle and foot: Secondary | ICD-10-CM | POA: Diagnosis not present

## 2021-09-17 DIAGNOSIS — M7661 Achilles tendinitis, right leg: Secondary | ICD-10-CM | POA: Diagnosis not present

## 2021-09-18 DIAGNOSIS — M9903 Segmental and somatic dysfunction of lumbar region: Secondary | ICD-10-CM | POA: Diagnosis not present

## 2021-09-18 DIAGNOSIS — M5442 Lumbago with sciatica, left side: Secondary | ICD-10-CM | POA: Diagnosis not present

## 2021-09-18 DIAGNOSIS — M48061 Spinal stenosis, lumbar region without neurogenic claudication: Secondary | ICD-10-CM | POA: Diagnosis not present

## 2021-09-23 DIAGNOSIS — H40021 Open angle with borderline findings, high risk, right eye: Secondary | ICD-10-CM | POA: Diagnosis not present

## 2021-09-23 DIAGNOSIS — H401123 Primary open-angle glaucoma, left eye, severe stage: Secondary | ICD-10-CM | POA: Diagnosis not present

## 2021-09-23 DIAGNOSIS — H25813 Combined forms of age-related cataract, bilateral: Secondary | ICD-10-CM | POA: Diagnosis not present

## 2021-09-25 DIAGNOSIS — M48061 Spinal stenosis, lumbar region without neurogenic claudication: Secondary | ICD-10-CM | POA: Diagnosis not present

## 2021-09-25 DIAGNOSIS — M5442 Lumbago with sciatica, left side: Secondary | ICD-10-CM | POA: Diagnosis not present

## 2021-09-25 DIAGNOSIS — M9903 Segmental and somatic dysfunction of lumbar region: Secondary | ICD-10-CM | POA: Diagnosis not present

## 2021-10-09 DIAGNOSIS — I10 Essential (primary) hypertension: Secondary | ICD-10-CM | POA: Diagnosis not present

## 2021-10-14 DIAGNOSIS — H40021 Open angle with borderline findings, high risk, right eye: Secondary | ICD-10-CM | POA: Diagnosis not present

## 2021-10-14 DIAGNOSIS — H25813 Combined forms of age-related cataract, bilateral: Secondary | ICD-10-CM | POA: Diagnosis not present

## 2021-10-14 DIAGNOSIS — H401123 Primary open-angle glaucoma, left eye, severe stage: Secondary | ICD-10-CM | POA: Diagnosis not present

## 2021-10-16 DIAGNOSIS — M9903 Segmental and somatic dysfunction of lumbar region: Secondary | ICD-10-CM | POA: Diagnosis not present

## 2021-10-16 DIAGNOSIS — M5442 Lumbago with sciatica, left side: Secondary | ICD-10-CM | POA: Diagnosis not present

## 2021-10-16 DIAGNOSIS — M48061 Spinal stenosis, lumbar region without neurogenic claudication: Secondary | ICD-10-CM | POA: Diagnosis not present

## 2021-10-23 DIAGNOSIS — M48061 Spinal stenosis, lumbar region without neurogenic claudication: Secondary | ICD-10-CM | POA: Diagnosis not present

## 2021-10-23 DIAGNOSIS — M5442 Lumbago with sciatica, left side: Secondary | ICD-10-CM | POA: Diagnosis not present

## 2021-10-23 DIAGNOSIS — M9903 Segmental and somatic dysfunction of lumbar region: Secondary | ICD-10-CM | POA: Diagnosis not present

## 2021-10-28 DIAGNOSIS — H25812 Combined forms of age-related cataract, left eye: Secondary | ICD-10-CM | POA: Diagnosis not present

## 2021-10-28 DIAGNOSIS — H401123 Primary open-angle glaucoma, left eye, severe stage: Secondary | ICD-10-CM | POA: Diagnosis not present

## 2021-10-28 DIAGNOSIS — H40021 Open angle with borderline findings, high risk, right eye: Secondary | ICD-10-CM | POA: Diagnosis not present

## 2021-10-30 DIAGNOSIS — M5442 Lumbago with sciatica, left side: Secondary | ICD-10-CM | POA: Diagnosis not present

## 2021-10-30 DIAGNOSIS — M9903 Segmental and somatic dysfunction of lumbar region: Secondary | ICD-10-CM | POA: Diagnosis not present

## 2021-10-30 DIAGNOSIS — M48061 Spinal stenosis, lumbar region without neurogenic claudication: Secondary | ICD-10-CM | POA: Diagnosis not present

## 2021-11-05 DIAGNOSIS — Z0000 Encounter for general adult medical examination without abnormal findings: Secondary | ICD-10-CM | POA: Diagnosis not present

## 2021-11-05 DIAGNOSIS — E7800 Pure hypercholesterolemia, unspecified: Secondary | ICD-10-CM | POA: Diagnosis not present

## 2021-11-05 DIAGNOSIS — Z1211 Encounter for screening for malignant neoplasm of colon: Secondary | ICD-10-CM | POA: Diagnosis not present

## 2021-11-05 DIAGNOSIS — Z6831 Body mass index (BMI) 31.0-31.9, adult: Secondary | ICD-10-CM | POA: Diagnosis not present

## 2021-11-05 DIAGNOSIS — E6609 Other obesity due to excess calories: Secondary | ICD-10-CM | POA: Diagnosis not present

## 2021-11-05 DIAGNOSIS — E559 Vitamin D deficiency, unspecified: Secondary | ICD-10-CM | POA: Diagnosis not present

## 2021-11-05 DIAGNOSIS — Z79899 Other long term (current) drug therapy: Secondary | ICD-10-CM | POA: Diagnosis not present

## 2021-11-05 DIAGNOSIS — Z1339 Encounter for screening examination for other mental health and behavioral disorders: Secondary | ICD-10-CM | POA: Diagnosis not present

## 2021-11-05 DIAGNOSIS — Z23 Encounter for immunization: Secondary | ICD-10-CM | POA: Diagnosis not present

## 2021-11-05 DIAGNOSIS — Z7189 Other specified counseling: Secondary | ICD-10-CM | POA: Diagnosis not present

## 2021-11-05 DIAGNOSIS — R5383 Other fatigue: Secondary | ICD-10-CM | POA: Diagnosis not present

## 2021-11-05 DIAGNOSIS — Z299 Encounter for prophylactic measures, unspecified: Secondary | ICD-10-CM | POA: Diagnosis not present

## 2021-11-05 DIAGNOSIS — Z1331 Encounter for screening for depression: Secondary | ICD-10-CM | POA: Diagnosis not present

## 2021-11-09 DIAGNOSIS — I10 Essential (primary) hypertension: Secondary | ICD-10-CM | POA: Diagnosis not present

## 2021-11-13 DIAGNOSIS — M48061 Spinal stenosis, lumbar region without neurogenic claudication: Secondary | ICD-10-CM | POA: Diagnosis not present

## 2021-11-13 DIAGNOSIS — M9903 Segmental and somatic dysfunction of lumbar region: Secondary | ICD-10-CM | POA: Diagnosis not present

## 2021-11-13 DIAGNOSIS — M5442 Lumbago with sciatica, left side: Secondary | ICD-10-CM | POA: Diagnosis not present

## 2021-11-16 DIAGNOSIS — E2839 Other primary ovarian failure: Secondary | ICD-10-CM | POA: Diagnosis not present

## 2021-11-19 DIAGNOSIS — M9903 Segmental and somatic dysfunction of lumbar region: Secondary | ICD-10-CM | POA: Diagnosis not present

## 2021-11-19 DIAGNOSIS — M5442 Lumbago with sciatica, left side: Secondary | ICD-10-CM | POA: Diagnosis not present

## 2021-11-19 DIAGNOSIS — M48061 Spinal stenosis, lumbar region without neurogenic claudication: Secondary | ICD-10-CM | POA: Diagnosis not present

## 2021-11-26 DIAGNOSIS — H2512 Age-related nuclear cataract, left eye: Secondary | ICD-10-CM | POA: Diagnosis not present

## 2021-11-26 DIAGNOSIS — H401123 Primary open-angle glaucoma, left eye, severe stage: Secondary | ICD-10-CM | POA: Diagnosis not present

## 2021-12-09 ENCOUNTER — Other Ambulatory Visit: Payer: Self-pay | Admitting: Adult Health

## 2021-12-09 DIAGNOSIS — G40209 Localization-related (focal) (partial) symptomatic epilepsy and epileptic syndromes with complex partial seizures, not intractable, without status epilepticus: Secondary | ICD-10-CM

## 2021-12-10 DIAGNOSIS — M5442 Lumbago with sciatica, left side: Secondary | ICD-10-CM | POA: Diagnosis not present

## 2021-12-10 DIAGNOSIS — M48061 Spinal stenosis, lumbar region without neurogenic claudication: Secondary | ICD-10-CM | POA: Diagnosis not present

## 2021-12-10 DIAGNOSIS — M9903 Segmental and somatic dysfunction of lumbar region: Secondary | ICD-10-CM | POA: Diagnosis not present

## 2021-12-17 DIAGNOSIS — Z713 Dietary counseling and surveillance: Secondary | ICD-10-CM | POA: Diagnosis not present

## 2021-12-17 DIAGNOSIS — E7800 Pure hypercholesterolemia, unspecified: Secondary | ICD-10-CM | POA: Diagnosis not present

## 2021-12-17 DIAGNOSIS — Z6831 Body mass index (BMI) 31.0-31.9, adult: Secondary | ICD-10-CM | POA: Diagnosis not present

## 2021-12-17 DIAGNOSIS — Z299 Encounter for prophylactic measures, unspecified: Secondary | ICD-10-CM | POA: Diagnosis not present

## 2021-12-17 DIAGNOSIS — I10 Essential (primary) hypertension: Secondary | ICD-10-CM | POA: Diagnosis not present

## 2022-01-01 DIAGNOSIS — M48061 Spinal stenosis, lumbar region without neurogenic claudication: Secondary | ICD-10-CM | POA: Diagnosis not present

## 2022-01-01 DIAGNOSIS — M5442 Lumbago with sciatica, left side: Secondary | ICD-10-CM | POA: Diagnosis not present

## 2022-01-01 DIAGNOSIS — M9903 Segmental and somatic dysfunction of lumbar region: Secondary | ICD-10-CM | POA: Diagnosis not present

## 2022-01-21 DIAGNOSIS — H401123 Primary open-angle glaucoma, left eye, severe stage: Secondary | ICD-10-CM | POA: Diagnosis not present

## 2022-02-03 ENCOUNTER — Telehealth: Payer: Self-pay | Admitting: *Deleted

## 2022-02-03 NOTE — Progress Notes (Signed)
  Care Coordination  Outreach Note  02/03/2022 Name: Tracy Lawson MRN: [id number] DOB: [birth year]   Care Coordination Outreach Attempts: An unsuccessful telephone outreach was attempted today to offer the patient information about available care coordination services as a benefit of their health plan.   Follow Up Plan:  Additional outreach attempts will be made to offer the patient care coordination information and services.   Encounter Outcome:  Pt. Request to Call [place]  Direct Dial: [phone]

## 2022-02-08 NOTE — Progress Notes (Unsigned)
  Care Coordination  Outreach Note  02/08/2022 Name: Tracy Lawson MRN: [id number] DOB: [birth year]   Care Coordination Outreach Attempts: A second unsuccessful outreach was attempted today to offer the patient with information about available care coordination services as a benefit of their health plan.     Follow Up Plan:  Additional outreach attempts will be made to offer the patient care coordination information and services.   Encounter Outcome:  Pt. Request to Call [place]: [phone]

## 2022-02-11 NOTE — Progress Notes (Signed)
  Care Coordination  Outreach Note  02/11/2022 Name: BIRDELL FRASIER MRN: [id number] DOB: 1950-08-13   Care Coordination Outreach Attempts: A third unsuccessful outreach was attempted today to offer the patient with information about available care coordination services as a benefit of their health plan.     Encounter Outcome:  No Answer  [place]  Direct Dial: [phone]

## 2022-02-18 DIAGNOSIS — H401123 Primary open-angle glaucoma, left eye, severe stage: Secondary | ICD-10-CM | POA: Diagnosis not present

## 2022-03-19 DIAGNOSIS — Z299 Encounter for prophylactic measures, unspecified: Secondary | ICD-10-CM | POA: Diagnosis not present

## 2022-03-19 DIAGNOSIS — I10 Essential (primary) hypertension: Secondary | ICD-10-CM | POA: Diagnosis not present

## 2022-03-19 DIAGNOSIS — R569 Unspecified convulsions: Secondary | ICD-10-CM | POA: Diagnosis not present

## 2022-03-19 DIAGNOSIS — Z6830 Body mass index (BMI) 30.0-30.9, adult: Secondary | ICD-10-CM | POA: Diagnosis not present

## 2022-04-07 DIAGNOSIS — M5442 Lumbago with sciatica, left side: Secondary | ICD-10-CM | POA: Diagnosis not present

## 2022-04-07 DIAGNOSIS — M9903 Segmental and somatic dysfunction of lumbar region: Secondary | ICD-10-CM | POA: Diagnosis not present

## 2022-04-07 DIAGNOSIS — M48061 Spinal stenosis, lumbar region without neurogenic claudication: Secondary | ICD-10-CM | POA: Diagnosis not present

## 2022-04-08 DIAGNOSIS — M5442 Lumbago with sciatica, left side: Secondary | ICD-10-CM | POA: Diagnosis not present

## 2022-04-08 DIAGNOSIS — M48061 Spinal stenosis, lumbar region without neurogenic claudication: Secondary | ICD-10-CM | POA: Diagnosis not present

## 2022-04-08 DIAGNOSIS — M9903 Segmental and somatic dysfunction of lumbar region: Secondary | ICD-10-CM | POA: Diagnosis not present

## 2022-04-20 DIAGNOSIS — M9903 Segmental and somatic dysfunction of lumbar region: Secondary | ICD-10-CM | POA: Diagnosis not present

## 2022-04-20 DIAGNOSIS — M48061 Spinal stenosis, lumbar region without neurogenic claudication: Secondary | ICD-10-CM | POA: Diagnosis not present

## 2022-04-20 DIAGNOSIS — M5442 Lumbago with sciatica, left side: Secondary | ICD-10-CM | POA: Diagnosis not present

## 2022-04-22 DIAGNOSIS — M5442 Lumbago with sciatica, left side: Secondary | ICD-10-CM | POA: Diagnosis not present

## 2022-04-22 DIAGNOSIS — M48061 Spinal stenosis, lumbar region without neurogenic claudication: Secondary | ICD-10-CM | POA: Diagnosis not present

## 2022-04-22 DIAGNOSIS — M9903 Segmental and somatic dysfunction of lumbar region: Secondary | ICD-10-CM | POA: Diagnosis not present

## 2022-04-27 DIAGNOSIS — M5442 Lumbago with sciatica, left side: Secondary | ICD-10-CM | POA: Diagnosis not present

## 2022-04-27 DIAGNOSIS — M9903 Segmental and somatic dysfunction of lumbar region: Secondary | ICD-10-CM | POA: Diagnosis not present

## 2022-04-27 DIAGNOSIS — M48061 Spinal stenosis, lumbar region without neurogenic claudication: Secondary | ICD-10-CM | POA: Diagnosis not present

## 2022-04-29 DIAGNOSIS — M48061 Spinal stenosis, lumbar region without neurogenic claudication: Secondary | ICD-10-CM | POA: Diagnosis not present

## 2022-04-29 DIAGNOSIS — M5442 Lumbago with sciatica, left side: Secondary | ICD-10-CM | POA: Diagnosis not present

## 2022-04-29 DIAGNOSIS — M9903 Segmental and somatic dysfunction of lumbar region: Secondary | ICD-10-CM | POA: Diagnosis not present

## 2022-05-11 DIAGNOSIS — M48061 Spinal stenosis, lumbar region without neurogenic claudication: Secondary | ICD-10-CM | POA: Diagnosis not present

## 2022-05-11 DIAGNOSIS — M5442 Lumbago with sciatica, left side: Secondary | ICD-10-CM | POA: Diagnosis not present

## 2022-05-11 DIAGNOSIS — M9903 Segmental and somatic dysfunction of lumbar region: Secondary | ICD-10-CM | POA: Diagnosis not present

## 2022-05-25 DIAGNOSIS — H25811 Combined forms of age-related cataract, right eye: Secondary | ICD-10-CM | POA: Diagnosis not present

## 2022-05-25 DIAGNOSIS — H401123 Primary open-angle glaucoma, left eye, severe stage: Secondary | ICD-10-CM | POA: Diagnosis not present

## 2022-05-25 DIAGNOSIS — H1131 Conjunctival hemorrhage, right eye: Secondary | ICD-10-CM | POA: Diagnosis not present

## 2022-05-25 DIAGNOSIS — Z961 Presence of intraocular lens: Secondary | ICD-10-CM | POA: Diagnosis not present

## 2022-05-25 DIAGNOSIS — H401111 Primary open-angle glaucoma, right eye, mild stage: Secondary | ICD-10-CM | POA: Diagnosis not present

## 2022-05-31 DIAGNOSIS — H2511 Age-related nuclear cataract, right eye: Secondary | ICD-10-CM | POA: Diagnosis not present

## 2022-06-01 DIAGNOSIS — Z1231 Encounter for screening mammogram for malignant neoplasm of breast: Secondary | ICD-10-CM | POA: Diagnosis not present

## 2022-06-01 IMAGING — DX DG PORTABLE PELVIS
1 series · 1 of 1 positions shown · non-contrast
Comparison: Seventy fluoroscopic image

CLINICAL DATA: Postop right hip

EXAM:
PORTABLE PELVIS 1-2 VIEWS

[pelvis ap]
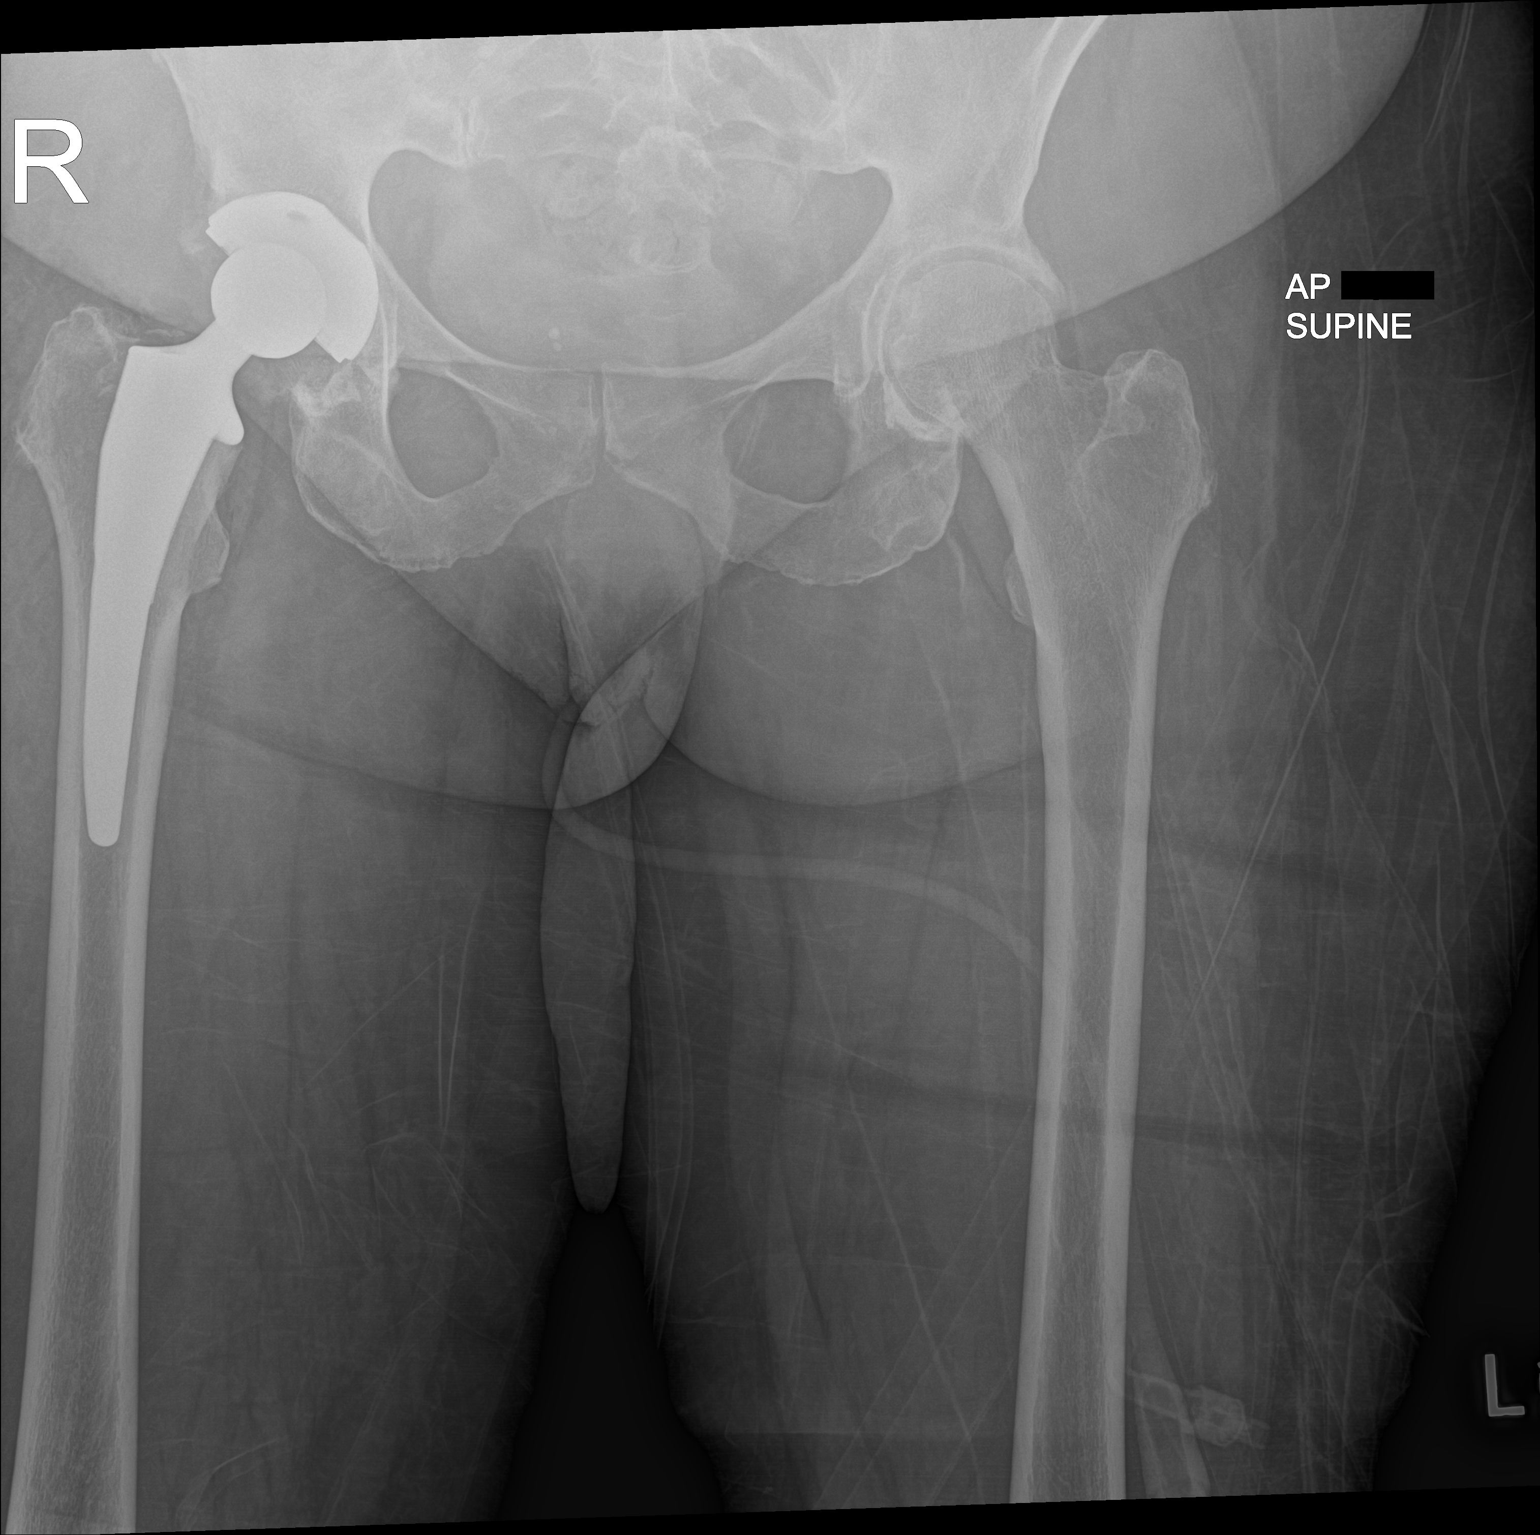

[1 of 1 positions shown; findings below may reference images not displayed]

FINDINGS: Interval postsurgical changes from right total hip arthroplasty.
Arthroplasty components appear in their expected alignment. No
periprosthetic fracture is identified. Expected postoperative
changes within the overlying soft tissues.
IMPRESSION: Expected postsurgical changes of right hip arthroplasty.

## 2022-06-01 IMAGING — RF DG HIP (WITH PELVIS) OPERATIVE*R*
1 series · 2 of 2 positions shown · non-contrast
Comparison: None.

CLINICAL DATA: Hip replacement

EXAM:
OPERATIVE RIGHT HIP (WITH PELVIS IF PERFORMED) 2 VIEWS
TECHNIQUE: Fluoroscopic spot image(s) were submitted for interpretation
post-operatively.

[Series 1: unknown protocol · 0.20mm/px · 2 of 2 slices shown]
[im 1/2]
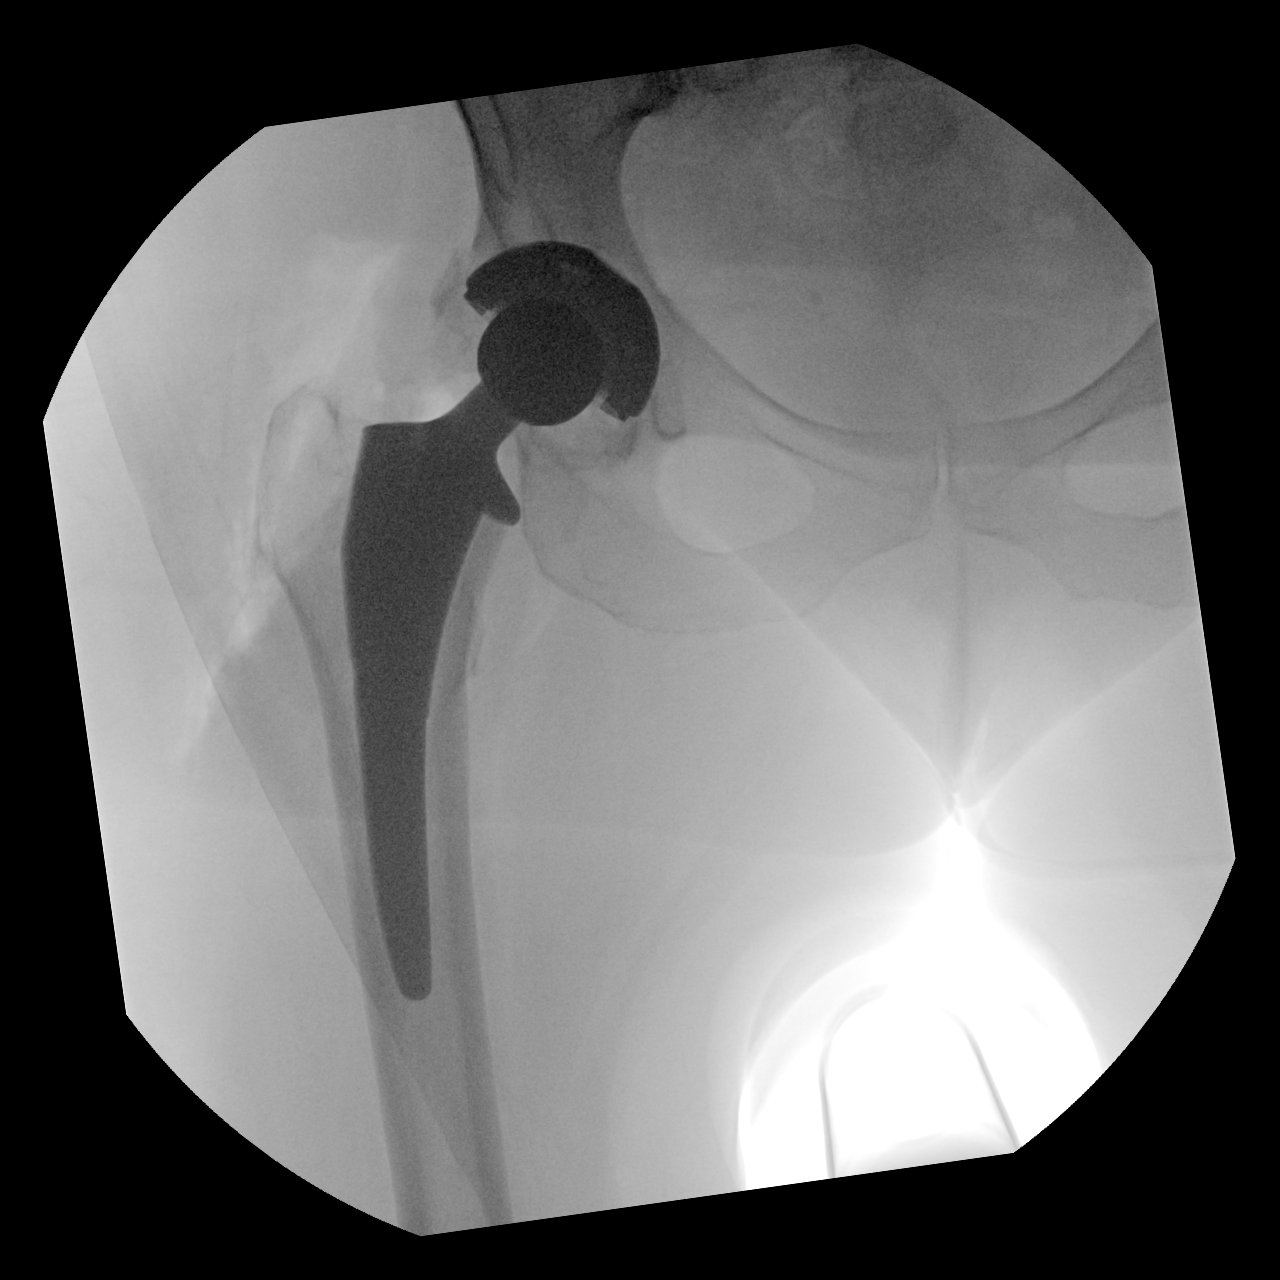
[im 2/2]
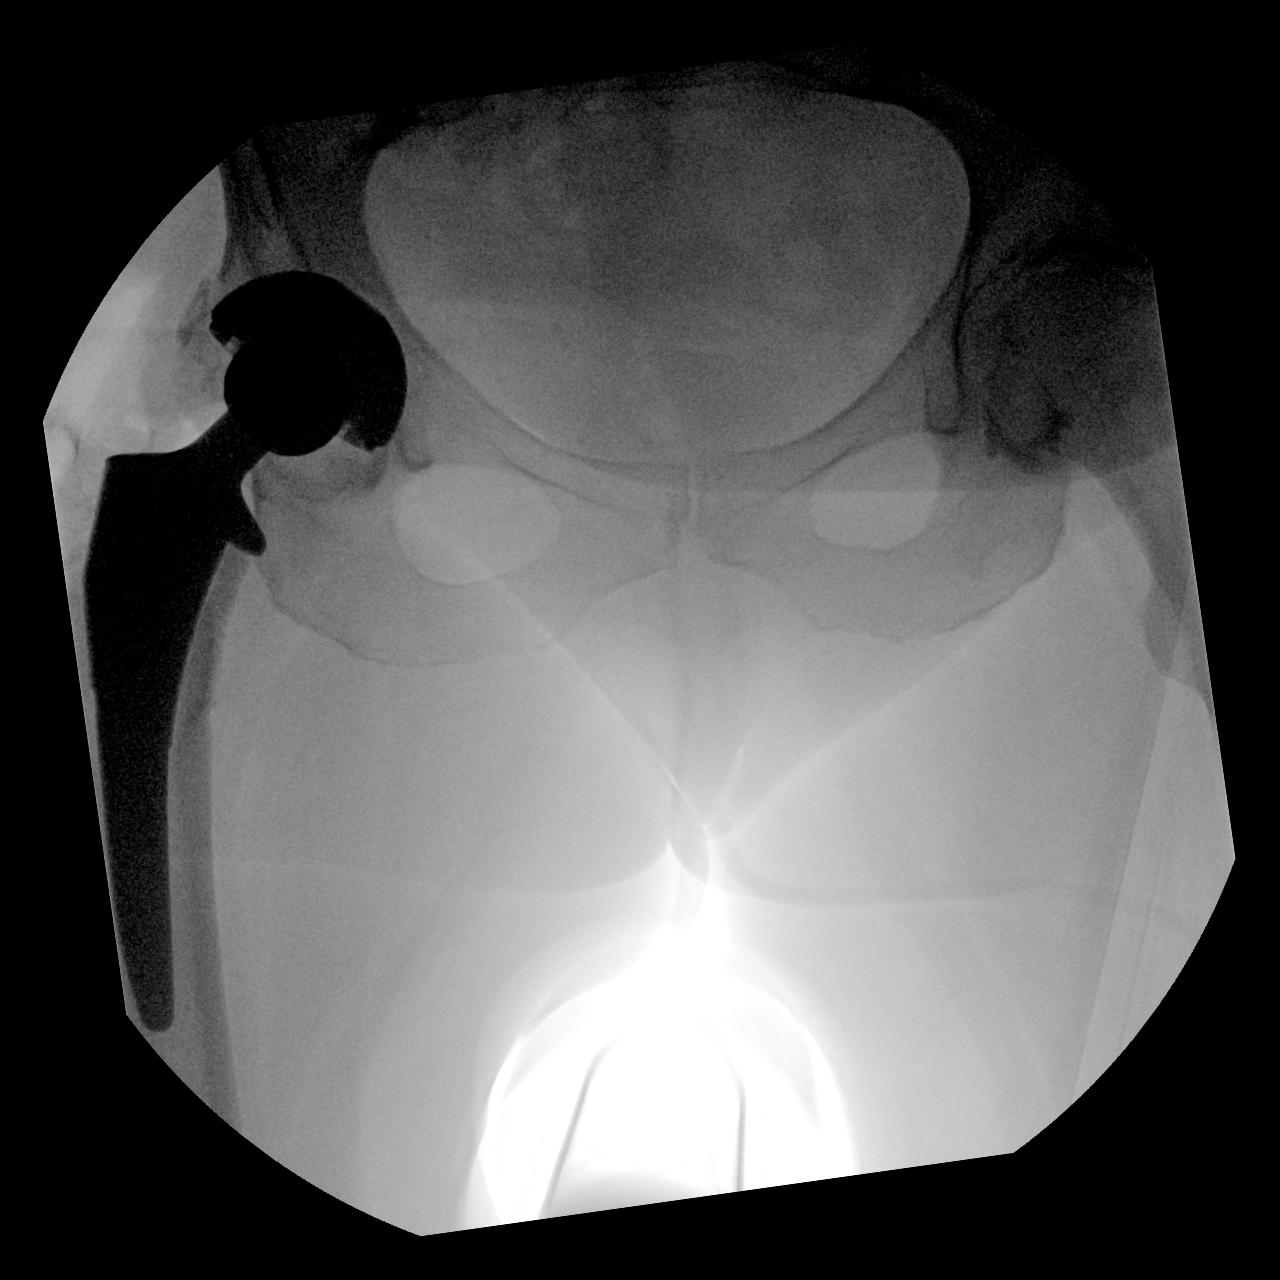

[2 of 2 positions shown; findings below may reference images not displayed]

FINDINGS: Changes of right hip replacement. Normal AP alignment. No hardware
bony complicating feature.
IMPRESSION: Right hip replacement.  No visible complicating feature.

## 2022-06-03 DIAGNOSIS — H2511 Age-related nuclear cataract, right eye: Secondary | ICD-10-CM | POA: Diagnosis not present

## 2022-06-03 DIAGNOSIS — H401111 Primary open-angle glaucoma, right eye, mild stage: Secondary | ICD-10-CM | POA: Diagnosis not present

## 2022-06-22 DIAGNOSIS — I10 Essential (primary) hypertension: Secondary | ICD-10-CM | POA: Diagnosis not present

## 2022-06-22 DIAGNOSIS — Z0000 Encounter for general adult medical examination without abnormal findings: Secondary | ICD-10-CM | POA: Diagnosis not present

## 2022-06-22 DIAGNOSIS — Z1331 Encounter for screening for depression: Secondary | ICD-10-CM | POA: Diagnosis not present

## 2022-06-22 DIAGNOSIS — Z1339 Encounter for screening examination for other mental health and behavioral disorders: Secondary | ICD-10-CM | POA: Diagnosis not present

## 2022-06-22 DIAGNOSIS — Z1389 Encounter for screening for other disorder: Secondary | ICD-10-CM | POA: Diagnosis not present

## 2022-06-22 DIAGNOSIS — Z299 Encounter for prophylactic measures, unspecified: Secondary | ICD-10-CM | POA: Diagnosis not present

## 2022-06-22 DIAGNOSIS — Z7189 Other specified counseling: Secondary | ICD-10-CM | POA: Diagnosis not present

## 2022-07-21 ENCOUNTER — Encounter: Payer: Self-pay | Admitting: Adult Health

## 2022-07-21 ENCOUNTER — Telehealth: Payer: Self-pay | Admitting: Adult Health

## 2022-07-21 NOTE — Telephone Encounter (Signed)
LVM and sent letter in mail informing pt of need to reschedule 08/25/22 appt - NP out

## 2022-08-25 ENCOUNTER — Encounter: Payer: Self-pay | Admitting: Adult Health

## 2022-08-25 ENCOUNTER — Ambulatory Visit: Payer: Medicare PPO | Admitting: Adult Health

## 2022-08-25 ENCOUNTER — Ambulatory Visit (INDEPENDENT_AMBULATORY_CARE_PROVIDER_SITE_OTHER): Payer: Medicare PPO | Admitting: Adult Health

## 2022-08-25 VITALS — BP 151/78 | HR 70 | Ht 65.0 in | Wt 179.0 lb

## 2022-08-25 DIAGNOSIS — G40209 Localization-related (focal) (partial) symptomatic epilepsy and epileptic syndromes with complex partial seizures, not intractable, without status epilepticus: Secondary | ICD-10-CM | POA: Diagnosis not present

## 2022-08-25 DIAGNOSIS — Z5181 Encounter for therapeutic drug level monitoring: Secondary | ICD-10-CM

## 2022-08-25 MED ORDER — LEVETIRACETAM 750 MG PO TABS: 750.0000 mg | ORAL_TABLET | Freq: Two times a day (BID) | ORAL | 3 refills | Status: DC

## 2022-08-25 MED ORDER — CARBAMAZEPINE ER 300 MG PO CP12: ORAL_CAPSULE | ORAL | 3 refills | Status: DC

## 2022-08-25 NOTE — Patient Instructions (Addendum)
Your Plan:  Continue Keppra and Carbamazepine  Blood work today  Thank you for coming to see [place] at [facility]. I hope we have been able to provide you high quality care today.  You may receive a patient satisfaction survey over the next few weeks. We would appreciate your feedback and comments so that we may continue to improve ourselves and the health of our patients.

## 2022-08-25 NOTE — Progress Notes (Signed)
Tracy Lawson: Tracy Lawson DOB: [birth year]  REASON FOR VISIT: follow up HISTORY FROM: Tracy Lawson PRIMARY NEUROLOGIST: [name]  Chief Complaint  Tracy Lawson presents with   Follow-up    Pt in 9 Pt here seizures f/u  Pt states no seizures since last office visit Pt states no questions or concerns for todays visit      HISTORY OF PRESENT ILLNESS: Today 08/25/22:  Tracy OGUINN is a 72 y.o. female with a history of Seizures. Returns today for follow-up. Overall doing well. No seizures. Remains on Keppra and Carbamazepine. Continues to operate a motor vehicle . Able to complete all ADLs independently. No change in gait or balance. Returns today for follow-up.   08/19/21: Tracy Lawson  is a 72 year old female with a history of seizures. She returns today for follow-up. She remains on Keppra 750 mg twice a day and carbamazepine 300 mg in the morning and 600 mg in the evening. No seizure events. Continues to drive. Able to complete ADLS independently. Didn't take clonidine because it makes her sleepy. Will take when she gets back home. Checks her BP at home and this morning it was 158/81. Not symptomatic today.  06/16/20: Tracy Lawson is a 72 year old female with a history of seizures.  She returns today for follow-up.  She remains on Keppra 750 mg twice a day and carbamazepine 300 mg in the morning and 600 mg in the evening.  She denies any seizure events.  Reports that she tolerates the medication well.  She operates a Librarian, academic without difficulty.  Blood pressure is elevated today she reports that she did not take her medication as it makes her drowsy.  She plans to take her medication as soon as she gets home.  05/14/19: Tracy Lawson is a 72 year old female with a history of seizures.  She returns today for follow-up.  She remains on Keppra and carbamazepine.  She denies any seizure events.  Denies any changes with her gait or balance.  She operates a Librarian, academic.  She is able to complete all ADLs  independently.  She lives at home with her spouse and 2 children.  She returns today for an evaluation.  HISTORY HPI: Is a long established Tracy Lawson of [name].   She was last seen in the office by my colleague, [name] on 07-27-12 after Dr. Imagene Gurney retirement. At the time of her presentation she had a fairly high blood pressure of 181/87 mmHg with a low pulse rate of 50. The Tracy Lawson is on clonidine,  but her main neurologic complaint is a long-standing seizure disorder which has been well controlled with medications. She is unaccompanied today as the seizures were described as simple partial at onset with some complex secondary generalization. Her first seizure was in 1993. The seizure occurred right after the birth of her second child, at 37 months gestational age. The pregnancy was complicated by toxemia and PRES. Initially treated with phenytoin she developed gum swelling and was switched to carbamazepine under which he become very sleepy, ataxic unsteady in her gait. They were  seizures while  on the medication, but she describes as pronounced by myoclonic twitches and speech arrest. Under her current regimen of Keppra  and carbamazepine,  she feels she's doing very well, she does however have a history of poorly controlled hypertension in the past. No aura , diplopia and no known hyponatremia.  She has no new complaints today. She denies any recent illness, aura, seizure, problems  with her medication or side effects. There is a new entry to her family history , her sister died of a Stroke in decemeber [address] , at age 48, her younger sister had a stroke, but is cared for at home.   REVIEW OF SYSTEMS: Out of a complete 14 system review of symptoms, the Tracy Lawson complains only of the following symptoms, and all other reviewed systems are negative.  See HPI  ALLERGIES: No Known Allergies  HOME MEDICATIONS: Outpatient Medications Prior to Visit  Medication Sig Dispense Refill   amLODipine  (NORVASC) 10 MG tablet Take 10 mg by mouth daily.     aspirin EC 325 MG EC tablet Take 1 tablet (325 mg total) by mouth 2 (two) times daily. Take for 21 days. Then take one 81 mg aspirin once a day for 21 days. Then discontinue aspirin. 42 tablet 0   aspirin EC 81 MG tablet Take 81 mg by mouth daily. Swallow whole.     atorvastatin (LIPITOR) 20 MG tablet Take 20 mg by mouth daily.     Calcium Carbonate-Vitamin D3 600-400 MG-UNIT TABS Take 1 tablet by mouth daily.     carbamazepine (CARBATROL) 300 MG 12 hr capsule TAKE 1 CAPSULE EVERY MORNING AND 2 CAPSULES AT BEDTIME. 270 capsule 2   cloNIDine (CATAPRES) 0.3 MG tablet Take 0.3 mg by mouth 2 (two) times daily.     Cod Liver Oil 1000 MG CAPS Take 1,000 mg by mouth daily.     furosemide (LASIX) 40 MG tablet Take 40 mg by mouth 2 (two) times daily.      HYDROcodone-acetaminophen (NORCO/VICODIN) 5-325 MG tablet Take 1-2 tablets by mouth every 6 (six) hours as needed for severe pain. 42 tablet 0   labetalol (NORMODYNE) 200 MG tablet Take 200 mg by mouth 2 (two) times daily.     levETIRAcetam (KEPPRA) 750 MG tablet TAKE (1) TABLET EVERY TWELVE HOURS. 180 tablet 3   Omega-3 Fatty Acids (OMEGA 3 500) 500 MG CAPS Take 1 capsule by mouth daily.     potassium chloride SA (K-DUR,KLOR-CON) 20 MEQ tablet Take 20 mEq by mouth daily.     Vitamin D, Ergocalciferol, (DRISDOL) 1.25 MG (50000 UNIT) CAPS capsule Take 50,000 Units by mouth every Monday.     zinc gluconate 50 MG tablet Take 50 mg by mouth daily.     methocarbamol (ROBAXIN) 500 MG tablet Take 1 tablet (500 mg total) by mouth every 6 (six) hours as needed for muscle spasms. 40 tablet 0   traMADol (ULTRAM) 50 MG tablet Take 1-2 tablets (50-100 mg total) by mouth every 6 (six) hours as needed for moderate pain. 40 tablet 0   No facility-administered medications prior to visit.    PAST MEDICAL HISTORY: Past Medical History:  Diagnosis Date   Arthritis    Localization-related (focal) (partial) epilepsy  and epileptic syndromes with complex partial seizures, without mention of intractable epilepsy    Pain in limb    Seizures (HCC)    Sleep disturbance, unspecified    Unspecified essential hypertension     PAST SURGICAL HISTORY: Past Surgical History:  Procedure Laterality Date   CESAREAN SECTION     x1   NO PAST SURGERIES     TOTAL HIP ARTHROPLASTY Right 10/08/2020   Procedure: TOTAL HIP ARTHROPLASTY ANTERIOR APPROACH;  Surgeon: Ollen Gross, MD;  Location: [facility];  Service: Orthopedics;  Laterality: Right;    FAMILY HISTORY: Family History  Problem Relation Age of Onset   Heart failure Mother  Stroke Father    Stroke Sister    Seizures Maternal Grandmother     SOCIAL HISTORY: Social History   Socioeconomic History   Marital status: Married    Spouse name: Chrissie Noa   Number of children: 2   Years of education: 14   Highest education level: Not on file  Occupational History   Not on file  Tobacco Use   Smoking status: Former    Current packs/day: 0.00    Types: Cigarettes    Quit date: 07/27/1992    Years since quitting: 30.0   Smokeless tobacco: Never  Vaping Use   Vaping status: Never Used  Substance and Sexual Activity   Alcohol use: No   Drug use: No   Sexual activity: Not Currently    Birth control/protection: None  Other Topics Concern   Not on file  Social History Narrative   Tracy Lawson is married Chrissie Noa) and lives with her husband, foster children and adopted children.   Tracy Lawson is retired.   Tracy Lawson has a college education.   Tracy Lawson is right-handed.   Tracy Lawson does not drink any caffeine.   Tracy Lawson has two children.   Social Determinants of Health   Financial Resource Strain: Not on file  Food Insecurity: Not on file  Transportation Needs: Not on file  Physical Activity: Not on file  Stress: Not on file  Social Connections: Not on file  Intimate Partner Violence: Not At Risk (07/22/2021)   Received from [facility]    Humiliation, Afraid, Rape, and Kick questionnaire    Fear of Current or Ex-Partner: No    Emotionally Abused: No    Physically Abused: No    Sexually Abused: No      PHYSICAL EXAM  Vitals:   08/25/22 1024  BP: (!) 151/78  Pulse: 70  Weight: 179 lb (81.2 kg)  Height: 5\' 5"  (1.651 m)   Body mass index is 29.79 kg/m.  Generalized: Well developed, in no acute distress   Neurological examination  Mentation: Alert oriented to time, place, history taking. Follows all commands speech and language fluent Cranial nerve II-XII: Extraocular movements were full, visual field were full on confrontational test. Head turning and shoulder shrug  were normal and symmetric. Motor: The motor testing reveals 5 over 5 strength of all 4 extremities. Mild give away weakness noted in LUE-Tracy Lawson states that its due to the way she slept last night. Good symmetric motor tone is noted throughout.  Sensory: Sensory testing is intact to soft touch on all 4 extremities. No evidence of extinction is noted.  Coordination: Cerebellar testing reveals good finger-nose-finger and heel-to-shin bilaterally.  Gait and station: Gait is normal.  Reflexes: Deep tendon reflexes are symmetric and normal bilaterally.   DIAGNOSTIC DATA (LABS, IMAGING, TESTING) - I reviewed Tracy Lawson records, labs, notes, testing and imaging myself where available.  Lab Results  Component Value Date   WBC 3.8 08/19/2021   HGB 12.3 08/19/2021   HCT 35.9 08/19/2021   MCV 88 08/19/2021   PLT 214 08/19/2021      Component Value Date/Time   NA 141 08/19/2021 1412   K 4.2 08/19/2021 1412   CL 103 08/19/2021 1412   CO2 25 08/19/2021 1412   GLUCOSE 105 (H) 08/19/2021 1412   GLUCOSE 128 (H) 10/09/2020 0316   BUN 13 08/19/2021 1412   CREATININE 0.78 08/19/2021 1412   CALCIUM 9.4 08/19/2021 1412   PROT 7.3 08/19/2021 1412   ALBUMIN 4.0 08/19/2021 1412  AST 24 08/19/2021 1412   ALT 15 08/19/2021 1412   ALKPHOS 118 08/19/2021 1412    BILITOT <0.2 08/19/2021 1412   GFRNONAA >60 10/09/2020 0316   GFRAA 88 05/14/2019 0954      ASSESSMENT AND PLAN 72 y.o. year old female  has a past medical history of Arthritis, Localization-related (focal) (partial) epilepsy and epileptic syndromes with complex partial seizures, without mention of intractable epilepsy, Pain in limb, Seizures (HCC), Sleep disturbance, unspecified, and Unspecified essential hypertension. here with:  1.  Seizures  -Continue carbamazepine 300 mg in a.m. and 600 mg at bedtime -Continue Keppra 750 mg twice a day -Blood work today -Advised if she has any seizure event she should let us know -Follow-up in 1 year or sooner if needed    Butch Penny, MSN, NP-C 08/25/2022, 10:49 AM [facility] [address], [address] [place], [place] [address] [phone]

## 2022-08-26 LAB — COMPREHENSIVE METABOLIC PANEL
ALT: 20 IU/L (ref 0–32)
AST: 25 IU/L (ref 0–40)
Albumin: 4 g/dL (ref 3.8–4.8)
Alkaline Phosphatase: 123 IU/L — ABNORMAL HIGH (ref 44–121)
BUN/Creatinine Ratio: 28 (ref 12–28)
BUN: 22 mg/dL (ref 8–27)
Bilirubin Total: <0.2 mg/dL (ref 0.0–1.2)
CO2: 26 mmol/L (ref 20–29)
Calcium: 9.1 mg/dL (ref 8.7–10.3)
Chloride: 108 mmol/L — ABNORMAL HIGH (ref 96–106)
Creatinine, Ser: 0.78 mg/dL (ref 0.57–1.00)
Globulin, Total: 2.8 g/dL (ref 1.5–4.5)
Glucose: 114 mg/dL — ABNORMAL HIGH (ref 70–99)
Potassium: 3.7 mmol/L (ref 3.5–5.2)
Sodium: 145 mmol/L — ABNORMAL HIGH (ref 134–144)
Total Protein: 6.8 g/dL (ref 6.0–8.5)
eGFR: 81 mL/min/{1.73_m2} (ref >59)

## 2022-08-26 LAB — CBC WITH DIFFERENTIAL/PLATELET
Basophils Absolute: 0 10*3/uL (ref 0.0–0.2)
Basos: 0 %
EOS (ABSOLUTE): 0 10*3/uL (ref 0.0–0.4)
Eos: 0 %
Hematocrit: 39.1 % (ref 34.0–46.6)
Hemoglobin: 13 g/dL (ref 11.1–15.9)
Immature Grans (Abs): 0 10*3/uL (ref 0.0–0.1)
Immature Granulocytes: 0 %
Lymphocytes Absolute: 1.3 10*3/uL (ref 0.7–3.1)
Lymphs: 27 %
MCH: 29.8 pg (ref 26.6–33.0)
MCHC: 33.2 g/dL (ref 31.5–35.7)
MCV: 90 fL (ref 79–97)
Monocytes Absolute: 0.4 10*3/uL (ref 0.1–0.9)
Monocytes: 7 %
Neutrophils Absolute: 3.1 10*3/uL (ref 1.4–7.0)
Neutrophils: 66 %
Platelets: 215 10*3/uL (ref 150–450)
RBC: 4.36 x10E6/uL (ref 3.77–5.28)
RDW: 13.2 % (ref 11.7–15.4)
WBC: 4.8 10*3/uL (ref 3.4–10.8)

## 2022-08-26 LAB — CARBAMAZEPINE LEVEL, TOTAL: Carbamazepine (Tegretol), S: 12.6 ug/mL (ref 4.0–12.0)

## 2022-08-27 ENCOUNTER — Telehealth: Payer: Self-pay | Admitting: *Deleted

## 2022-08-27 NOTE — Telephone Encounter (Signed)
Spoke to pt gave lab work results Per Megan,NP Next week get level redrawn . Per Megan,NP  don't take Carbamazepine the morning of redraw and bring medication  with her to lab draw Take medication after lab redraw  Pt expressed understanding and thanked me for calling

## 2022-08-27 NOTE — Telephone Encounter (Signed)
-----   Message from [facility] sent at 08/27/2022 10:28 AM EDT ----- Carbamazepine level increased. Has been in the past. No signs of toxicity on exam. Will monitor.

## 2022-09-07 ENCOUNTER — Other Ambulatory Visit: Payer: Self-pay | Admitting: *Deleted

## 2022-09-07 ENCOUNTER — Other Ambulatory Visit (INDEPENDENT_AMBULATORY_CARE_PROVIDER_SITE_OTHER): Payer: Self-pay

## 2022-09-07 DIAGNOSIS — G40209 Localization-related (focal) (partial) symptomatic epilepsy and epileptic syndromes with complex partial seizures, not intractable, without status epilepticus: Secondary | ICD-10-CM | POA: Diagnosis not present

## 2022-09-07 DIAGNOSIS — Z5181 Encounter for therapeutic drug level monitoring: Secondary | ICD-10-CM

## 2022-09-07 DIAGNOSIS — Z0289 Encounter for other administrative examinations: Secondary | ICD-10-CM

## 2022-09-08 LAB — CARBAMAZEPINE LEVEL, TOTAL: Carbamazepine (Tegretol), S: 10 ug/mL (ref 4.0–12.0)

## 2022-09-24 DIAGNOSIS — Z23 Encounter for immunization: Secondary | ICD-10-CM | POA: Diagnosis not present

## 2022-10-25 DIAGNOSIS — H401111 Primary open-angle glaucoma, right eye, mild stage: Secondary | ICD-10-CM | POA: Diagnosis not present

## 2022-10-25 DIAGNOSIS — Z961 Presence of intraocular lens: Secondary | ICD-10-CM | POA: Diagnosis not present

## 2022-10-25 DIAGNOSIS — H401123 Primary open-angle glaucoma, left eye, severe stage: Secondary | ICD-10-CM | POA: Diagnosis not present

## 2022-11-09 DIAGNOSIS — E559 Vitamin D deficiency, unspecified: Secondary | ICD-10-CM | POA: Diagnosis not present

## 2022-11-09 DIAGNOSIS — I10 Essential (primary) hypertension: Secondary | ICD-10-CM | POA: Diagnosis not present

## 2022-11-09 DIAGNOSIS — Z299 Encounter for prophylactic measures, unspecified: Secondary | ICD-10-CM | POA: Diagnosis not present

## 2022-11-09 DIAGNOSIS — Z1211 Encounter for screening for malignant neoplasm of colon: Secondary | ICD-10-CM | POA: Diagnosis not present

## 2022-11-09 DIAGNOSIS — R5383 Other fatigue: Secondary | ICD-10-CM | POA: Diagnosis not present

## 2022-11-09 DIAGNOSIS — Z0000 Encounter for general adult medical examination without abnormal findings: Secondary | ICD-10-CM | POA: Diagnosis not present

## 2022-11-09 DIAGNOSIS — E7800 Pure hypercholesterolemia, unspecified: Secondary | ICD-10-CM | POA: Diagnosis not present

## 2022-11-09 DIAGNOSIS — Z79899 Other long term (current) drug therapy: Secondary | ICD-10-CM | POA: Diagnosis not present

## 2022-11-18 DIAGNOSIS — H401111 Primary open-angle glaucoma, right eye, mild stage: Secondary | ICD-10-CM | POA: Diagnosis not present

## 2022-12-08 DIAGNOSIS — R7303 Prediabetes: Secondary | ICD-10-CM | POA: Diagnosis not present

## 2022-12-08 DIAGNOSIS — Z299 Encounter for prophylactic measures, unspecified: Secondary | ICD-10-CM | POA: Diagnosis not present

## 2022-12-08 DIAGNOSIS — R569 Unspecified convulsions: Secondary | ICD-10-CM | POA: Diagnosis not present

## 2022-12-08 DIAGNOSIS — I10 Essential (primary) hypertension: Secondary | ICD-10-CM | POA: Diagnosis not present

## 2022-12-13 ENCOUNTER — Ambulatory Visit: Payer: Medicare PPO | Admitting: Adult Health

## 2022-12-13 DIAGNOSIS — H401123 Primary open-angle glaucoma, left eye, severe stage: Secondary | ICD-10-CM | POA: Diagnosis not present

## 2022-12-13 DIAGNOSIS — H401111 Primary open-angle glaucoma, right eye, mild stage: Secondary | ICD-10-CM | POA: Diagnosis not present

## 2022-12-13 DIAGNOSIS — Z961 Presence of intraocular lens: Secondary | ICD-10-CM | POA: Diagnosis not present

## 2022-12-15 ENCOUNTER — Encounter: Payer: Self-pay | Admitting: *Deleted

## 2022-12-21 DIAGNOSIS — E041 Nontoxic single thyroid nodule: Secondary | ICD-10-CM | POA: Diagnosis not present

## 2022-12-21 DIAGNOSIS — I639 Cerebral infarction, unspecified: Secondary | ICD-10-CM | POA: Diagnosis not present

## 2022-12-21 DIAGNOSIS — I6389 Other cerebral infarction: Secondary | ICD-10-CM | POA: Diagnosis not present

## 2022-12-21 DIAGNOSIS — R4781 Slurred speech: Secondary | ICD-10-CM | POA: Diagnosis not present

## 2022-12-21 DIAGNOSIS — G8194 Hemiplegia, unspecified affecting left nondominant side: Secondary | ICD-10-CM | POA: Diagnosis not present

## 2022-12-21 DIAGNOSIS — I10 Essential (primary) hypertension: Secondary | ICD-10-CM | POA: Diagnosis not present

## 2022-12-21 DIAGNOSIS — R2681 Unsteadiness on feet: Secondary | ICD-10-CM | POA: Diagnosis not present

## 2022-12-21 DIAGNOSIS — R200 Anesthesia of skin: Secondary | ICD-10-CM | POA: Diagnosis not present

## 2022-12-21 DIAGNOSIS — R202 Paresthesia of skin: Secondary | ICD-10-CM | POA: Diagnosis not present

## 2022-12-21 DIAGNOSIS — R29810 Facial weakness: Secondary | ICD-10-CM | POA: Diagnosis not present

## 2022-12-21 DIAGNOSIS — R0689 Other abnormalities of breathing: Secondary | ICD-10-CM | POA: Diagnosis not present

## 2022-12-21 DIAGNOSIS — Z7982 Long term (current) use of aspirin: Secondary | ICD-10-CM | POA: Diagnosis not present

## 2022-12-21 DIAGNOSIS — I672 Cerebral atherosclerosis: Secondary | ICD-10-CM | POA: Diagnosis not present

## 2022-12-21 DIAGNOSIS — R471 Dysarthria and anarthria: Secondary | ICD-10-CM | POA: Diagnosis not present

## 2022-12-21 DIAGNOSIS — G40909 Epilepsy, unspecified, not intractable, without status epilepticus: Secondary | ICD-10-CM | POA: Diagnosis not present

## 2022-12-21 DIAGNOSIS — I6782 Cerebral ischemia: Secondary | ICD-10-CM | POA: Diagnosis not present

## 2022-12-21 DIAGNOSIS — I6522 Occlusion and stenosis of left carotid artery: Secondary | ICD-10-CM | POA: Diagnosis not present

## 2022-12-21 DIAGNOSIS — I088 Other rheumatic multiple valve diseases: Secondary | ICD-10-CM | POA: Diagnosis not present

## 2022-12-21 DIAGNOSIS — R531 Weakness: Secondary | ICD-10-CM | POA: Diagnosis not present

## 2022-12-21 DIAGNOSIS — I6501 Occlusion and stenosis of right vertebral artery: Secondary | ICD-10-CM | POA: Diagnosis not present

## 2022-12-21 DIAGNOSIS — I6601 Occlusion and stenosis of right middle cerebral artery: Secondary | ICD-10-CM | POA: Diagnosis not present

## 2022-12-21 DIAGNOSIS — Z79899 Other long term (current) drug therapy: Secondary | ICD-10-CM | POA: Diagnosis not present

## 2022-12-21 DIAGNOSIS — E785 Hyperlipidemia, unspecified: Secondary | ICD-10-CM | POA: Diagnosis not present

## 2022-12-22 DIAGNOSIS — I6602 Occlusion and stenosis of left middle cerebral artery: Secondary | ICD-10-CM | POA: Diagnosis not present

## 2022-12-22 DIAGNOSIS — I639 Cerebral infarction, unspecified: Secondary | ICD-10-CM | POA: Diagnosis not present

## 2022-12-22 DIAGNOSIS — I10 Essential (primary) hypertension: Secondary | ICD-10-CM | POA: Diagnosis not present

## 2022-12-22 DIAGNOSIS — E079 Disorder of thyroid, unspecified: Secondary | ICD-10-CM | POA: Diagnosis not present

## 2022-12-22 DIAGNOSIS — I071 Rheumatic tricuspid insufficiency: Secondary | ICD-10-CM | POA: Diagnosis not present

## 2022-12-22 DIAGNOSIS — E785 Hyperlipidemia, unspecified: Secondary | ICD-10-CM | POA: Diagnosis not present

## 2022-12-22 DIAGNOSIS — R29818 Other symptoms and signs involving the nervous system: Secondary | ICD-10-CM | POA: Diagnosis not present

## 2022-12-22 DIAGNOSIS — G40909 Epilepsy, unspecified, not intractable, without status epilepticus: Secondary | ICD-10-CM | POA: Diagnosis not present

## 2022-12-27 DIAGNOSIS — E042 Nontoxic multinodular goiter: Secondary | ICD-10-CM | POA: Diagnosis not present

## 2022-12-28 DIAGNOSIS — E042 Nontoxic multinodular goiter: Secondary | ICD-10-CM | POA: Diagnosis not present

## 2022-12-28 DIAGNOSIS — Z299 Encounter for prophylactic measures, unspecified: Secondary | ICD-10-CM | POA: Diagnosis not present

## 2022-12-28 DIAGNOSIS — I6509 Occlusion and stenosis of unspecified vertebral artery: Secondary | ICD-10-CM | POA: Diagnosis not present

## 2022-12-28 DIAGNOSIS — R569 Unspecified convulsions: Secondary | ICD-10-CM | POA: Diagnosis not present

## 2022-12-28 DIAGNOSIS — I10 Essential (primary) hypertension: Secondary | ICD-10-CM | POA: Diagnosis not present

## 2022-12-28 DIAGNOSIS — R634 Abnormal weight loss: Secondary | ICD-10-CM | POA: Diagnosis not present

## 2022-12-29 DIAGNOSIS — E785 Hyperlipidemia, unspecified: Secondary | ICD-10-CM | POA: Diagnosis not present

## 2022-12-29 DIAGNOSIS — E041 Nontoxic single thyroid nodule: Secondary | ICD-10-CM | POA: Diagnosis not present

## 2022-12-29 DIAGNOSIS — I119 Hypertensive heart disease without heart failure: Secondary | ICD-10-CM | POA: Diagnosis not present

## 2022-12-29 DIAGNOSIS — I69392 Facial weakness following cerebral infarction: Secondary | ICD-10-CM | POA: Diagnosis not present

## 2022-12-29 DIAGNOSIS — I69398 Other sequelae of cerebral infarction: Secondary | ICD-10-CM | POA: Diagnosis not present

## 2022-12-29 DIAGNOSIS — I69322 Dysarthria following cerebral infarction: Secondary | ICD-10-CM | POA: Diagnosis not present

## 2022-12-29 DIAGNOSIS — R200 Anesthesia of skin: Secondary | ICD-10-CM | POA: Diagnosis not present

## 2022-12-29 DIAGNOSIS — R29898 Other symptoms and signs involving the musculoskeletal system: Secondary | ICD-10-CM | POA: Diagnosis not present

## 2022-12-29 DIAGNOSIS — G40409 Other generalized epilepsy and epileptic syndromes, not intractable, without status epilepticus: Secondary | ICD-10-CM | POA: Diagnosis not present

## 2022-12-30 ENCOUNTER — Other Ambulatory Visit: Payer: Self-pay | Admitting: *Deleted

## 2022-12-30 ENCOUNTER — Encounter: Payer: Self-pay | Admitting: Internal Medicine

## 2022-12-30 ENCOUNTER — Ambulatory Visit (INDEPENDENT_AMBULATORY_CARE_PROVIDER_SITE_OTHER): Payer: Medicare PPO | Admitting: Internal Medicine

## 2022-12-30 ENCOUNTER — Telehealth: Payer: Self-pay

## 2022-12-30 ENCOUNTER — Telehealth: Payer: Self-pay | Admitting: Adult Health

## 2022-12-30 VITALS — BP 200/116 | HR 80 | Temp 97.5°F | Ht 65.5 in | Wt 167.6 lb

## 2022-12-30 DIAGNOSIS — Z87891 Personal history of nicotine dependence: Secondary | ICD-10-CM | POA: Diagnosis not present

## 2022-12-30 DIAGNOSIS — I10 Essential (primary) hypertension: Secondary | ICD-10-CM | POA: Diagnosis not present

## 2022-12-30 DIAGNOSIS — R531 Weakness: Secondary | ICD-10-CM

## 2022-12-30 DIAGNOSIS — Z1211 Encounter for screening for malignant neoplasm of colon: Secondary | ICD-10-CM

## 2022-12-30 DIAGNOSIS — Z79899 Other long term (current) drug therapy: Secondary | ICD-10-CM

## 2022-12-30 DIAGNOSIS — K5909 Other constipation: Secondary | ICD-10-CM

## 2022-12-30 DIAGNOSIS — K5904 Chronic idiopathic constipation: Secondary | ICD-10-CM

## 2022-12-30 NOTE — Telephone Encounter (Signed)
Received call from Nurse case manager Vita Barley at [facility] in [place]. Stated pt has had a CVA and they did a CTA head and neck which revealed  IMPRESSION:  1. No acute head CT finding. Extensive chronic small-vessel ischemic  change of the white matter and basal ganglia. Progressive ischemic  changes in the left basal ganglia/radiating white matter tract  regions as well as in the right frontal white matter compared with  the study of January 2022, but these are not acute and would not  relate to an insult 6 hours ago.  2. Aortic atherosclerosis.  3. Severe stenosis of the right vertebral artery origin, 80% or  greater. Beyond that, the vessel is patent through the cervical  region to the foramen magnum.  4. Mild stenosis of the left ICA bulb, 25%.  5. Moderate to severe stenosis of the right MCA in the M1 segment.  Moderate stenosis of bilateral MCA M2 branches. More distal branch  vessels also show atherosclerotic irregularity.  6. Heterogeneous thyroid gland. The largest nodule on the left in  the lower pole measures 1.7 cm. Recommend thyroid US.   Stated they have referred patient to vascular for the stenosis but per [facility] in [place] they do not treat MCA stenosis. She is scheduled with [facility] for 01/12/22 for vertebral stenosis and mild left ICA bulb disease. She is requesting advice for the MCA stenosis and who to send patient to for that, if we can see patient for this or where does she need to go? Please advise, this is a new issue and will need a follow up with [name] but I was not sure what to recommend as we don't have anything with her anytime soon. Thank you. Return call to Vita Barley at [phone] ext. [phone]

## 2022-12-30 NOTE — Progress Notes (Signed)
Primary Care Physician:  Kirstie Peri, MD Primary Gastroenterologist:  Dr. Marletta Lor  Chief Complaint  Patient presents with   New Patient (Initial Visit)    Ov before colonoscopy    HPI:   Tracy Lawson is a 72 y.o. female who presents   Past Medical History:  Diagnosis Date   Arthritis    Localization-related (focal) (partial) epilepsy and epileptic syndromes with complex partial seizures, without mention of intractable epilepsy    Pain in limb    Seizures (HCC)    Sleep disturbance, unspecified    Unspecified essential hypertension     Past Surgical History:  Procedure Laterality Date   CESAREAN SECTION     x1   NO PAST SURGERIES     TOTAL HIP ARTHROPLASTY Right 10/08/2020   Procedure: TOTAL HIP ARTHROPLASTY ANTERIOR APPROACH;  Surgeon: Ollen Gross, MD;  Location: [facility];  Service: Orthopedics;  Laterality: Right;    Current Outpatient Medications  Medication Sig Dispense Refill   amLODipine (NORVASC) 10 MG tablet Take 10 mg by mouth daily.     aspirin EC 325 MG EC tablet Take 1 tablet (325 mg total) by mouth 2 (two) times daily. Take for 21 days. Then take one 81 mg aspirin once a day for 21 days. Then discontinue aspirin. 42 tablet 0   aspirin EC 81 MG tablet Take 81 mg by mouth daily. Swallow whole.     atorvastatin (LIPITOR) 20 MG tablet Take 20 mg by mouth daily.     Calcium Carbonate-Vitamin D3 600-400 MG-UNIT TABS Take 1 tablet by mouth daily.     carbamazepine (CARBATROL) 300 MG 12 hr capsule TAKE 1 CAPSULE EVERY MORNING AND 2 CAPSULES AT BEDTIME. 270 capsule 3   cloNIDine (CATAPRES) 0.3 MG tablet Take 0.3 mg by mouth 2 (two) times daily.     levETIRAcetam (KEPPRA) 750 MG tablet Take 1 tablet (750 mg total) by mouth 2 (two) times daily. 180 tablet 3   potassium chloride SA (K-DUR,KLOR-CON) 20 MEQ tablet Take 20 mEq by mouth daily.     Vitamin D, Ergocalciferol, (DRISDOL) 1.25 MG (50000 UNIT) CAPS capsule Take 50,000 Units by mouth every Monday.     Cod  Liver Oil 1000 MG CAPS Take 1,000 mg by mouth daily. (Patient not taking: Reported on 12/30/2022)     furosemide (LASIX) 40 MG tablet Take 40 mg by mouth 2 (two) times daily.  (Patient not taking: Reported on 12/30/2022)     HYDROcodone-acetaminophen (NORCO/VICODIN) 5-325 MG tablet Take 1-2 tablets by mouth every 6 (six) hours as needed for severe pain. (Patient not taking: Reported on 12/30/2022) 42 tablet 0   labetalol (NORMODYNE) 200 MG tablet Take 200 mg by mouth 2 (two) times daily. (Patient not taking: Reported on 12/30/2022)     methocarbamol (ROBAXIN) 500 MG tablet Take 1 tablet (500 mg total) by mouth every 6 (six) hours as needed for muscle spasms. (Patient not taking: Reported on 12/30/2022) 40 tablet 0   Omega-3 Fatty Acids (OMEGA 3 500) 500 MG CAPS Take 1 capsule by mouth daily. (Patient not taking: Reported on 12/30/2022)     traMADol (ULTRAM) 50 MG tablet Take 1-2 tablets (50-100 mg total) by mouth every 6 (six) hours as needed for moderate pain. (Patient not taking: Reported on 12/30/2022) 40 tablet 0   zinc gluconate 50 MG tablet Take 50 mg by mouth daily. (Patient not taking: Reported on 12/30/2022)     No current facility-administered medications for this visit.  Allergies as of 12/30/2022   (No Known Allergies)    Family History  Problem Relation Age of Onset   Heart failure Mother    Stroke Father    Stroke Sister    Seizures Maternal Grandmother     Social History   Socioeconomic History   Marital status: Married    Spouse name: Chrissie Noa   Number of children: 2   Years of education: 14   Highest education level: Not on file  Occupational History   Not on file  Tobacco Use   Smoking status: Former    Current packs/day: 0.00    Types: Cigarettes    Quit date: 07/27/1992    Years since quitting: 30.4   Smokeless tobacco: Never  Vaping Use   Vaping status: Never Used  Substance and Sexual Activity   Alcohol use: No   Drug use: No   Sexual activity: Not  Currently    Birth control/protection: None  Other Topics Concern   Not on file  Social History Narrative   Patient is married Chrissie Noa) and lives with her husband, foster children and adopted children.   Patient is retired.   Patient has a college education.   Patient is right-handed.   Patient does not drink any caffeine.   Patient has two children.   Social Drivers of Corporate investment banker Strain: Low Risk  (12/21/2022)   Received from [facility]   Overall Financial Resource Strain (CARDIA)    Difficulty of Paying Living Expenses: Not hard at all  Food Insecurity: No Food Insecurity (12/21/2022)   Received from [facility]   Hunger Vital Sign    Worried About Running Out of Food in the Last Year: Never true    Ran Out of Food in the Last Year: Never true  Transportation Needs: No Transportation Needs (12/21/2022)   Received from [facility] - Transportation    Lack of Transportation (Medical): No    Lack of Transportation (Non-Medical): No  Physical Activity: Insufficiently Active (12/21/2022)   Received from [facility]   Exercise Vital Sign    Days of Exercise per Week: 4 days    Minutes of Exercise per Session: 20 min  Stress: No Stress Concern Present (12/21/2022)   Received from [facility] - Occupational Stress Questionnaire    Feeling of Stress : Only a little  Social Connections: Moderately Integrated (12/21/2022)   Received from [facility]   Social Connection and Isolation Panel [NHANES]    Frequency of Communication with Friends and Family: More than three times a week    Frequency of Social Gatherings with Friends and Family: More than three times a week    Attends Religious Services: More than 4 times per year    Active Member of [employer] or Organizations: No    Attends Banker Meetings: Never    Marital Status: Married  Catering manager Violence: Not At Risk  (12/21/2022)   Received from [facility]   Humiliation, Afraid, Rape, and Kick questionnaire    Fear of Current or Ex-Partner: No    Emotionally Abused: No    Physically Abused: No    Sexually Abused: No    Subjective: Review of Systems  Constitutional:  Negative for chills and fever.  HENT:  Negative for congestion and hearing loss.   Eyes:  Negative for blurred vision and double vision.  Respiratory:  Negative for cough and shortness of breath.   Cardiovascular:  Negative for chest pain and palpitations.  Gastrointestinal:  Negative for abdominal pain, blood in stool, constipation, diarrhea, heartburn, melena and vomiting.  Genitourinary:  Negative for dysuria and urgency.  Musculoskeletal:  Negative for joint pain and myalgias.  Skin:  Negative for itching and rash.  Neurological:  Negative for dizziness and headaches.  Psychiatric/Behavioral:  Negative for depression. The patient is not nervous/anxious.        Objective: BP (!) 200/116   Pulse 80   Temp (!) 97.5 F (36.4 C)   Ht 5' 5.5" (1.664 m)   Wt 167 lb 9.6 oz (76 kg)   BMI 27.47 kg/m  Physical Exam Constitutional:      Appearance: Normal appearance.  HENT:     Head: Normocephalic and atraumatic.  Eyes:     Extraocular Movements: Extraocular movements intact.     Conjunctiva/sclera: Conjunctivae normal.  Cardiovascular:     Rate and Rhythm: Normal rate and regular rhythm.  Pulmonary:     Effort: Pulmonary effort is normal.     Breath sounds: Normal breath sounds.  Abdominal:     General: Bowel sounds are normal.     Palpations: Abdomen is soft.  Musculoskeletal:        General: No swelling. Normal range of motion.     Cervical back: Normal range of motion and neck supple.  Skin:    General: Skin is warm and dry.     Coloration: Skin is not jaundiced.  Neurological:     General: No focal deficit present.     Mental Status: She is alert and oriented to person, place, and time.  Psychiatric:         Mood and Affect: Mood normal.        Behavior: Behavior normal.      Assessment/Plan:  1.  Colon cancer screening-patient certainly due for colonoscopy for colon cancer screening purposes however suffered a stroke 1 week ago requiring hospital admission.  Currently on Plavix with plans for 21 days of treatment.  Will hold off on scheduling for now.  Follow-up in 4 to 6 weeks and we can reconsider at that time.  2.  Chronic constipation- For patient's constipation, I recommended taking MiraLAX 1 capful daily.  If this is not adequate then increase to 2 capfuls daily.  If this is still not adequate then add on once daily Dulcolax.  Encouraged to drink at least 6 glasses of water daily.  Consider trial of Linzess if not improved.  3.  Hypertension-patient with significantly elevated blood pressure today in office.  States she did not take any of her blood pressure medications today due to side effects as she knew she had to come to physician office.  She did take all of them during her visit today.  Still elevated on recheck.  No headache, no chest pain or shortness of breath.  Patient told to recheck blood pressure at home.  She does state she has a home cuff.  If still elevated she will contact her PCP.  She does mention she saw him a few days ago and her blood pressure was well-controlled.  Recommend she go to the emergency room if she has chest pain, shortness of breath, headache, dizziness, neurological changes.  Follow-up in 4 to 6 weeks  12/30/2022 3:52 PM   Disclaimer: This note was dictated with voice recognition software. Similar sounding words can inadvertently be transcribed and may not be  corrected upon review.

## 2022-12-30 NOTE — Patient Instructions (Signed)
For your constipation, I want you to start taking over the counter MiraLAX 1 capful daily.  If this does not adequately control your constipation, I would increase to 2 capfuls daily.  If this is still not adequate, then I would add on once daily Dulcolax (bisacodyl) tablet.   I also recommend increasing fiber in your diet or adding OTC Benefiber/Metamucil. Be sure to drink at least 4 to 6 glasses of water daily.   I agree that you need colonoscopies but we will hold off for now given your recent stroke and current Plavix use.  Follow-up in 4 to 6 weeks and we can discuss scheduling at that time.  I want you to monitor your blood pressure at home.  If remains elevated, please contact your PCP.  If you have chest pain, shortness of breath, severe headache, then you may need to be evaluated in the emergency room.  It was very nice meeting you today.  Dr. Marletta Lor

## 2022-12-30 NOTE — Telephone Encounter (Signed)
Pt phoned back to advise that her blood pressure has gone down to 179/90. Pt states she layed down when she got home.

## 2022-12-31 ENCOUNTER — Ambulatory Visit (HOSPITAL_COMMUNITY)
Admission: RE | Admit: 2022-12-31 | Discharge: 2022-12-31 | Disposition: A | Discharge status: Home / Self Care | Payer: Medicare PPO | Source: Ambulatory Visit | Attending: Vascular Surgery | Admitting: Vascular Surgery

## 2022-12-31 DIAGNOSIS — R531 Weakness: Secondary | ICD-10-CM | POA: Diagnosis not present

## 2023-01-02 NOTE — Telephone Encounter (Signed)
Noted, thank you for the update

## 2023-01-03 DIAGNOSIS — G40909 Epilepsy, unspecified, not intractable, without status epilepticus: Secondary | ICD-10-CM | POA: Diagnosis not present

## 2023-01-03 DIAGNOSIS — E041 Nontoxic single thyroid nodule: Secondary | ICD-10-CM | POA: Diagnosis not present

## 2023-01-03 DIAGNOSIS — I10 Essential (primary) hypertension: Secondary | ICD-10-CM | POA: Diagnosis not present

## 2023-01-03 DIAGNOSIS — Z87891 Personal history of nicotine dependence: Secondary | ICD-10-CM | POA: Diagnosis not present

## 2023-01-03 DIAGNOSIS — I160 Hypertensive urgency: Secondary | ICD-10-CM | POA: Diagnosis not present

## 2023-01-03 DIAGNOSIS — Z79899 Other long term (current) drug therapy: Secondary | ICD-10-CM | POA: Diagnosis not present

## 2023-01-03 DIAGNOSIS — Z7901 Long term (current) use of anticoagulants: Secondary | ICD-10-CM | POA: Diagnosis not present

## 2023-01-03 DIAGNOSIS — R0989 Other specified symptoms and signs involving the circulatory and respiratory systems: Secondary | ICD-10-CM | POA: Diagnosis not present

## 2023-01-03 DIAGNOSIS — Z7982 Long term (current) use of aspirin: Secondary | ICD-10-CM | POA: Diagnosis not present

## 2023-01-03 DIAGNOSIS — E785 Hyperlipidemia, unspecified: Secondary | ICD-10-CM | POA: Diagnosis not present

## 2023-01-03 DIAGNOSIS — Z8673 Personal history of transient ischemic attack (TIA), and cerebral infarction without residual deficits: Secondary | ICD-10-CM | POA: Diagnosis not present

## 2023-01-03 DIAGNOSIS — R42 Dizziness and giddiness: Secondary | ICD-10-CM | POA: Diagnosis not present

## 2023-01-03 DIAGNOSIS — R519 Headache, unspecified: Secondary | ICD-10-CM | POA: Diagnosis not present

## 2023-01-03 DIAGNOSIS — Z7902 Long term (current) use of antithrombotics/antiplatelets: Secondary | ICD-10-CM | POA: Diagnosis not present

## 2023-01-04 DIAGNOSIS — Z79899 Other long term (current) drug therapy: Secondary | ICD-10-CM | POA: Diagnosis not present

## 2023-01-04 DIAGNOSIS — E785 Hyperlipidemia, unspecified: Secondary | ICD-10-CM | POA: Diagnosis not present

## 2023-01-04 DIAGNOSIS — Z7902 Long term (current) use of antithrombotics/antiplatelets: Secondary | ICD-10-CM | POA: Diagnosis not present

## 2023-01-04 DIAGNOSIS — I160 Hypertensive urgency: Secondary | ICD-10-CM | POA: Diagnosis not present

## 2023-01-04 DIAGNOSIS — G40909 Epilepsy, unspecified, not intractable, without status epilepticus: Secondary | ICD-10-CM | POA: Diagnosis not present

## 2023-01-04 DIAGNOSIS — Z8673 Personal history of transient ischemic attack (TIA), and cerebral infarction without residual deficits: Secondary | ICD-10-CM | POA: Diagnosis not present

## 2023-01-04 DIAGNOSIS — E041 Nontoxic single thyroid nodule: Secondary | ICD-10-CM | POA: Diagnosis not present

## 2023-01-06 NOTE — Telephone Encounter (Signed)
Noted  

## 2023-01-07 DIAGNOSIS — I69359 Hemiplegia and hemiparesis following cerebral infarction affecting unspecified side: Secondary | ICD-10-CM | POA: Diagnosis not present

## 2023-01-07 DIAGNOSIS — I10 Essential (primary) hypertension: Secondary | ICD-10-CM | POA: Diagnosis not present

## 2023-01-07 DIAGNOSIS — Z299 Encounter for prophylactic measures, unspecified: Secondary | ICD-10-CM | POA: Diagnosis not present

## 2023-01-08 DIAGNOSIS — R202 Paresthesia of skin: Secondary | ICD-10-CM | POA: Diagnosis not present

## 2023-01-08 DIAGNOSIS — I69322 Dysarthria following cerebral infarction: Secondary | ICD-10-CM | POA: Diagnosis not present

## 2023-01-08 DIAGNOSIS — R29898 Other symptoms and signs involving the musculoskeletal system: Secondary | ICD-10-CM | POA: Diagnosis not present

## 2023-01-08 DIAGNOSIS — I69398 Other sequelae of cerebral infarction: Secondary | ICD-10-CM | POA: Diagnosis not present

## 2023-01-10 ENCOUNTER — Encounter: Payer: Self-pay | Admitting: Neurology

## 2023-01-10 ENCOUNTER — Ambulatory Visit (INDEPENDENT_AMBULATORY_CARE_PROVIDER_SITE_OTHER): Payer: Medicare PPO | Admitting: Neurology

## 2023-01-10 VITALS — BP 153/81 | HR 76 | Ht 65.0 in | Wt 173.0 lb

## 2023-01-10 DIAGNOSIS — I63319 Cerebral infarction due to thrombosis of unspecified middle cerebral artery: Secondary | ICD-10-CM

## 2023-01-10 DIAGNOSIS — I63511 Cerebral infarction due to unspecified occlusion or stenosis of right middle cerebral artery: Secondary | ICD-10-CM

## 2023-01-10 MED ORDER — CLOPIDOGREL BISULFATE 75 MG PO TABS: 75.0000 mg | ORAL_TABLET | Freq: Every day | ORAL | 11 refills | Status: DC

## 2023-01-10 NOTE — Patient Instructions (Signed)
CVA with watershed findings on MRI 12-21-2022 ,    Severe stenosis of the right vertebral artery origin, 80% or  greater. Beyond that, the vessel is patent through the cervical  region to the foramen magnum.  4. Mild stenosis of the left ICA bulb, 25%.  5. Moderate to severe stenosis of the right MCA in the M1 segment.  Moderate stenosis of bilateral MCA M2 branches. More distal branch  vessels also show atherosclerotic irregularity.      Clearly ataxia , dysmetria on the left arm/ hand.  Reports blurred vision.        1)  plan for interventional radiology to see patient - is it possible to treat the MCI stenosis?  Referral to dr deveshwar.  2) doppler of the carotid arteries,    HTN control  She is not diabetic, she is pre-diabetic she declared, I can't see a recent fasting glucose level.  Normal GFR and liver function, normal TSH.  High LDL cholesterol.  Needs statin and will continue.    She will continue on Plavix for at east 6 months. Stays on baby ASA also.    Plan:  Treatment plan and additional workup planned after today includes:    1) referral to Dr Tretha Sciara.

## 2023-01-10 NOTE — Progress Notes (Signed)
[facility]  Provider:  Melvyn Novas, MD Sleep medicine / Neurology   Referring Provider: Kirstie Peri, MD Primary Care Physician:  Tracy Peri, MD  Chief Complaint  Patient presents with   New Patient (Initial Visit)    Patient in room #2 with here daughter. Referral for CVA, outside records- [facility]-  Patient is  needing a doppler ultasound.    HPI:  Tracy Lawson is a 72 y.o. female and seen here upon referral from Dr. Sherryll Lawson for a Consultation/ Evaluation of CVA  and risk factors(?) . Patient is followed here for seizures since 1994. Dr. Sandria Lawson.  [name] first visited the local hospital in [place] on December 10 with symptoms that were related to high blood pressure.  She is a right-handed individual and had noticed left-sided weakness, with left hand numbness, and facial drooping.  Her daughter informed me that the ptosis that is noted about both eyes but more pronounced on the left has been evidence ever since cataract surgery. By now her facial facial symmetry has been restored.  She then presented again to the emergency room on 03 January 2023, and released on 24th.  In her first day a brain MRI have been obtained a noncontrast head CT and a CT angiogram of the head and neck had also been obtained - they showed no age advanced or  parenchymal atrophy, there were known small scattered subacute infarcts in the right cerebral hemisphere  ( seen first on 12-21-2022) these were within the MCA ACA and PCA watershed territories and a largely occluded by CT they were much better appreciated on the brain MRI that had been obtained on 10 so he on 12-21-2022.  Chronic lacunar infarcts were again seen, and the background appeared moderately patchy.  There was no hemorrhage, no extra-axial fluid, and a partially empty sella to her sicca was noted.  Atherosclerotic calcifications of some vessels were noted.    The study was read by Dr. Cathren Lawson.  The  angiographic findings were much more important.  There was aortic atherosclerosis, there was severe stenosis of the right vertebral artery at origin 80% or greater there was mild stenosis of the left internal carotid artery at the bulb 25% moderate to severe stenosis of the right MCA in the M1 segment, moderate stenosis M2 branch, and atherosclerotic irregularities.  There were nodules noted in her thyroid.  She was scheduled for vein and vascular services on 1-2 2025, she was not yet scheduled with Stroke MD, or interventional radiology.  This patient reports onset of symptoms of CVA on 12-21-2022 , and again 01-03-2023, with a documented watershed infarct.    She reports ongoing blurred vision.    Review of Systems: Out of a complete 14 system review, the patient complains of only the following symptoms, and all other reviewed systems are negative.  Ptosis left , blurred vision, no changes in colour perception.   Social History   Socioeconomic History   Marital status: Married    Spouse name: Tracy Lawson   Number of children: 2   Years of education: 14   Highest education level: Not on file  Occupational History   Not on file  Tobacco Use   Smoking status: Former    Current packs/day: 0.00    Types: Cigarettes    Quit date: 07/27/1992    Years since quitting: 30.4   Smokeless tobacco: Never  Vaping Use   Vaping status: Never Used  Substance and Sexual  Activity   Alcohol use: No   Drug use: No   Sexual activity: Not Currently    Birth control/protection: None  Other Topics Concern   Not on file  Social History Narrative   Patient is married Tracy Lawson) and lives with her husband, foster children and adopted children.   Patient is retired.   Patient has a college education.   Patient is right-handed.   Patient does not drink any caffeine.   Patient has two children.   Social Drivers of Corporate investment banker Strain: Low Risk  (12/21/2022)   Received from [facility]    Overall Financial Resource Strain (CARDIA)    Difficulty of Paying Living Expenses: Not hard at all  Food Insecurity: No Food Insecurity (12/21/2022)   Received from [facility]   Hunger Vital Sign    Worried About Running Out of Food in the Last Year: Never true    Ran Out of Food in the Last Year: Never true  Transportation Needs: No Transportation Needs (12/21/2022)   Received from [facility] - Transportation    Lack of Transportation (Medical): No    Lack of Transportation (Non-Medical): No  Physical Activity: Insufficiently Active (12/21/2022)   Received from [facility]   Exercise Vital Sign    Days of Exercise per Week: 4 days    Minutes of Exercise per Session: 20 min  Stress: No Stress Concern Present (12/21/2022)   Received from [facility] - Occupational Stress Questionnaire    Feeling of Stress : Only a little  Social Connections: Moderately Integrated (12/21/2022)   Received from [facility]   Social Connection and Isolation Panel [NHANES]    Frequency of Communication with Friends and Family: More than three times a week    Frequency of Social Gatherings with Friends and Family: More than three times a week    Attends Religious Services: More than 4 times per year    Active Member of [employer] or Organizations: No    Attends Banker Meetings: Never    Marital Status: Married  Catering manager Violence: Not At Risk (12/21/2022)   Received from [facility]   Humiliation, Afraid, Rape, and Kick questionnaire    Fear of Current or Ex-Partner: No    Emotionally Abused: No    Physically Abused: No    Sexually Abused: No    Family History  Problem Relation Age of Onset   Heart failure Mother    Stroke Father    Stroke Sister    Seizures Maternal Grandmother     Past Medical History:  Diagnosis Date   Arthritis    Localization-related (focal) (partial) epilepsy and  epileptic syndromes with complex partial seizures, without mention of intractable epilepsy, primary Neurologist was  Dr Tracy Manly, MD. Now Senita Corredor, MD- Butch Penny, NP .     Pain in limb    CVA 12-2022     Severe HTN    Unspecified essential hypertension     Past Surgical History:  Procedure Laterality Date   CESAREAN SECTION     x1   NO PAST SURGERIES     TOTAL HIP ARTHROPLASTY Right 10/08/2020   Procedure: TOTAL HIP ARTHROPLASTY ANTERIOR APPROACH;  Surgeon: Ollen Gross, MD;  Location: [facility];  Service: Orthopedics;  Laterality: Right;    Current Outpatient Medications  Medication Sig Dispense Refill   amLODipine (NORVASC) 10 MG tablet Take  10 mg by mouth daily.     aspirin EC 325 MG EC tablet Take 1 tablet (325 mg total) by mouth 2 (two) times daily. Take for 21 days. Then take one 81 mg aspirin once a day for 21 days. Then discontinue aspirin. 42 tablet 0   aspirin EC 81 MG tablet Take 81 mg by mouth daily. Swallow whole.     atorvastatin (LIPITOR) 20 MG tablet Take 20 mg by mouth daily.     Calcium Carbonate-Vitamin D3 600-400 MG-UNIT TABS Take 1 tablet by mouth daily.     cloNIDine (CATAPRES) 0.3 MG tablet Take 0.3 mg by mouth 2 (two) times daily.     levETIRAcetam (KEPPRA) 750 MG tablet Take 1 tablet (750 mg total) by mouth 2 (two) times daily. 180 tablet 3   potassium chloride SA (K-DUR,KLOR-CON) 20 MEQ tablet Take 20 mEq by mouth daily.     Vitamin D, Ergocalciferol, (DRISDOL) 1.25 MG (50000 UNIT) CAPS capsule Take 50,000 Units by mouth every Monday.     carbamazepine (CARBATROL) 300 MG 12 hr capsule TAKE 1 CAPSULE EVERY MORNING AND 2 CAPSULES AT BEDTIME. (Patient not taking: Reported on 01/10/2023) 270 capsule 3   Cod Liver Oil 1000 MG CAPS Take 1,000 mg by mouth daily. (Patient not taking: Reported on 12/30/2022)     furosemide (LASIX) 40 MG tablet Take 40 mg by mouth 2 (two) times daily.  (Patient not taking: Reported on 12/30/2022)     HYDROcodone-acetaminophen  (NORCO/VICODIN) 5-325 MG tablet Take 1-2 tablets by mouth every 6 (six) hours as needed for severe pain. (Patient not taking: Reported on 01/10/2023) 42 tablet 0   labetalol (NORMODYNE) 200 MG tablet Take 200 mg by mouth 2 (two) times daily. (Patient not taking: Reported on 01/10/2023)     methocarbamol (ROBAXIN) 500 MG tablet Take 1 tablet (500 mg total) by mouth every 6 (six) hours as needed for muscle spasms. (Patient not taking: Reported on 01/10/2023) 40 tablet 0   Omega-3 Fatty Acids (OMEGA 3 500) 500 MG CAPS Take 1 capsule by mouth daily. (Patient not taking: Reported on 12/30/2022)     traMADol (ULTRAM) 50 MG tablet Take 1-2 tablets (50-100 mg total) by mouth every 6 (six) hours as needed for moderate pain. (Patient not taking: Reported on 01/10/2023) 40 tablet 0   zinc gluconate 50 MG tablet Take 50 mg by mouth daily. (Patient not taking: Reported on 12/30/2022)     No current facility-administered medications for this visit.    Allergies as of 01/10/2023   (No Known Allergies)    Vitals: BP (!) 153/81 (BP Location: Left Arm, Patient Position: Sitting, Cuff Size: Small)   Pulse 76   Ht 5\' 5"  (1.651 m)   Wt 173 lb (78.5 kg)   BMI 28.79 kg/m  Last Weight:  Wt Readings from Last 1 Encounters:  01/10/23 173 lb (78.5 kg)   Last Height:   Ht Readings from Last 1 Encounters:  01/10/23 5\' 5"  (1.651 m)   Last BMI: @LASTBMI  Physical exam:  General: The patient is awake, alert and appears not in acute distress.  The patient is well groomed. Head: Normocephalic, atraumatic.  Neck is supple.    Neck circumference:15 Cardiovascular:  Regular rate and palpable peripheral pulse:  Respiratory: clear to auscultation.  Mallampati2, Skin:  Without evidence of edema, or rash Trunk: patient  has normal posture.   Neurologic exam : The patient is awake and alert, oriented to place and time.  Memory subjective  described  as intact.  There is a normal attention span & concentration  ability.  Speech is fluent with dysarthria, dysphonia.  Mood and affect are appropriate.  Cranial nerves: Pupils are equal and briskly reactive to light. Funduscopic exam without  evidence of pallor or edema. Extraocular movements  in vertical and horizontal planes intact and without nystagmus. Visual fields by finger perimetry are intact. Hearing to finger rub intact.  Facial sensation intact to fine touch. Facial motor strength is symmetric and tongue and uvula move midline.  Motor exam:   Normal tone and normal muscle bulk and symmetric normal strength in all extremities. Grip Strength equal .  Proximal strength of shoulder muscles and hip flexors was intact .  Sensory:  Fine touch and vibration were tested . Vibration was felt at ankles , knee, wrist . Proprioception was tested in the upper extremities - impaired on the left.  Coordination: Rapid alternating movements in the fingers/hands were normal.  Finger-to-nose maneuver was tested and showed evidence of ataxia, dysmetria on the left.  Gait and station: Patient walked without assistive device , Core Strength within normal limits. Stance is stable and of normal base.  Tandem gait is impaired -she  drifts to the left   Romberg testing was deferred.   Deep tendon reflexes: in the  upper and lower extremities are symmetric and  brisk without Clonus. Babinski maneuver response is left up-going,    Assessment: Total time for face to face interview and examination, for review of  images and laboratory testing, neurophysiology testing and pre-existing records, including out-of -network , was 45 minutes. Assessment is as follows here:   CVA with watershed findings on MRI 12-21-2022 ,   Severe stenosis of the right vertebral artery origin, 80% or  greater. Beyond that, the vessel is patent through the cervical  region to the foramen magnum.  4. Mild stenosis of the left ICA bulb, 25%.  5. Moderate to severe stenosis of the right MCA  in the M1 segment.  Moderate stenosis of bilateral MCA M2 branches. More distal branch  vessels also show atherosclerotic irregularity.    Clearly ataxia , dysmetria on the left arm/ hand.  Reports blurred vision.     1)  plan for interventional radiology to see patient - is it possible to treat the MCI stenosis?  Referral to dr deveshwar.  2) doppler of the carotid arteries,   HTN control  She is not diabetic, she is pre-diabetic she declared, I can't see a recent fasting glucose level.  Normal GFR and liver function, normal TSH.  High LDL cholesterol.  Needs statin and will continue.   She will continue on Plavix for at east 6 months. Stays on baby ASA also.   Plan:  Treatment plan and additional workup planned after today includes:   1) referral to Dr Tretha Sciara.    Tracy Novas, MD

## 2023-01-10 NOTE — Addendum Note (Signed)
Addended by: Melvyn Novas on: 01/10/2023 12:19 PM   Modules accepted: Orders

## 2023-01-12 NOTE — Progress Notes (Signed)
 Office Note     CC: Vertebral artery stenosis with history of stroke Requesting Provider:  Elisabeth Brutus BRAVO, PA-C  HPI: Tracy Lawson is a 73 y.o. ([birth year]) female presenting at the request of .Maree Isles, MD after recent stroke with CT imaging demonstrating ostial stenosis of the right vertebral artery.   Lumen has a history of seizures since 1994.  She presented last month to the ED with left-sided weakness, hand numbness, facial drooping, which improved.  Brain MRI demonstrated chronic lacunar infarcts with small, scattered subacute infarcts in the right cerebral hemisphere.  CTA noted aortic atherosclerosis, severe stenosis of the right vertebral artery at origin 80% or greater there was mild stenosis of the left internal carotid artery at the bulb 25% moderate to severe stenosis of the right MCA in the M1 segment, moderate stenosis M2 branch, and atherosclerotic irregularities.   On exam today Tracy Lawson was doing well, accompanied by her son.  A native of [place], she has lived there her entire life.  She worked for [employer] for over 25 years, and is now retired.  [name] has had issues with uncontrolled hypertension.  One week after her right-sided TIA, she represented to the hospital and spent 2 days as an inpatient for her hypertension.  Medications were changed, and she noted severe lethargy with blood pressure improvement.During her visit today, systolic blood pressure was over 200 -she was asymptomatic.  Per her and her son, she has been compliant with her meds since that time.  She is a non-smoker.  Denies residual left-sided deficits other than some blurriness in the left eye.   The pt is  on a statin for cholesterol management.  The pt is  on a daily aspirin .    The pt is  on medication for hypertension.   The pt is not diabetic.  Tobacco hx:  non  Past Medical History:  Diagnosis Date   Arthritis    Localization-related (focal) (partial) epilepsy and  epileptic syndromes with complex partial seizures, without mention of intractable epilepsy    Pain in limb    Seizures (HCC)    Sleep disturbance, unspecified    Unspecified essential hypertension     Past Surgical History:  Procedure Laterality Date   CESAREAN SECTION     x1   NO PAST SURGERIES     TOTAL HIP ARTHROPLASTY Right 10/08/2020   Procedure: TOTAL HIP ARTHROPLASTY ANTERIOR APPROACH;  Surgeon: Melodi Lerner, MD;  Location: [facility];  Service: Orthopedics;  Laterality: Right;    Social History   Socioeconomic History   Marital status: Married    Spouse name: Elsie   Number of children: 2   Years of education: 14   Highest education level: Not on file  Occupational History   Not on file  Tobacco Use   Smoking status: Former    Current packs/day: 0.00    Types: Cigarettes    Quit date: 07/27/1992    Years since quitting: 30.4   Smokeless tobacco: Never  Vaping Use   Vaping status: Never Used  Substance and Sexual Activity   Alcohol  use: No   Drug use: No   Sexual activity: Not Currently    Birth control/protection: None  Other Topics Concern   Not on file  Social History Narrative   Patient is married Thermon) and lives with her husband, foster children and adopted children.   Patient is retired.   Patient has a college education.   Patient is right-handed.  Patient does not drink any caffeine.   Patient has two children.   Social Drivers of Corporate Investment Banker Strain: Low Risk  (12/21/2022)   Received from [facility]   Overall Financial Resource Strain (CARDIA)    Difficulty of Paying Living Expenses: Not hard at all  Food Insecurity: No Food Insecurity (12/21/2022)   Received from [facility]   Hunger Vital Sign    Worried About Running Out of Food in the Last Year: Never true    Ran Out of Food in the Last Year: Never true  Transportation Needs: No Transportation Needs (12/21/2022)   Received from [facility] -  Transportation    Lack of Transportation (Medical): No    Lack of Transportation (Non-Medical): No  Physical Activity: Insufficiently Active (12/21/2022)   Received from [facility]   Exercise Vital Sign    Days of Exercise per Week: 4 days    Minutes of Exercise per Session: 20 min  Stress: No Stress Concern Present (12/21/2022)   Received from [facility] - Occupational Stress Questionnaire    Feeling of Stress : Only a little  Social Connections: Moderately Integrated (12/21/2022)   Received from [facility]   Social Connection and Isolation Panel [NHANES]    Frequency of Communication with Friends and Family: More than three times a week    Frequency of Social Gatherings with Friends and Family: More than three times a week    Attends Religious Services: More than 4 times per year    Active Member of [employer] or Organizations: No    Attends Banker Meetings: Never    Marital Status: Married  Catering Manager Violence: Not At Risk (12/21/2022)   Received from [facility]   Humiliation, Afraid, Rape, and Kick questionnaire    Fear of Current or Ex-Partner: No    Emotionally Abused: No    Physically Abused: No    Sexually Abused: No   Family History  Problem Relation Age of Onset   Heart failure Mother    Stroke Father    Stroke Sister    Seizures Maternal Grandmother     Current Outpatient Medications  Medication Sig Dispense Refill   amLODipine  (NORVASC ) 10 MG tablet Take 10 mg by mouth daily.     aspirin  EC 81 MG tablet Take 81 mg by mouth daily. Swallow whole.     atorvastatin  (LIPITOR) 20 MG tablet Take 20 mg by mouth daily.     Calcium  Carbonate-Vitamin D3 600-400 MG-UNIT TABS Take 1 tablet by mouth daily.     carbamazepine  (CARBATROL ) 300 MG 12 hr capsule TAKE 1 CAPSULE EVERY MORNING AND 2 CAPSULES AT BEDTIME. (Patient not taking: Reported on 01/10/2023) 270 capsule 3   cloNIDine  (CATAPRES ) 0.3 MG  tablet Take 0.3 mg by mouth 2 (two) times daily.     clopidogrel  (PLAVIX ) 75 MG tablet Take 1 tablet (75 mg total) by mouth daily. Take one a day po . 30 tablet 11   Cod Liver Oil 1000 MG CAPS Take 1,000 mg by mouth daily. (Patient not taking: Reported on 12/30/2022)     furosemide  (LASIX ) 40 MG tablet Take 40 mg by mouth 2 (two) times daily.  (Patient not taking: Reported on 12/30/2022)     HYDROcodone -acetaminophen  (NORCO/VICODIN) 5-325 MG tablet Take 1-2 tablets by mouth every 6 (six) hours as needed for severe pain. (Patient not taking: Reported  on 01/10/2023) 42 tablet 0   labetalol  (NORMODYNE ) 200 MG tablet Take 200 mg by mouth 2 (two) times daily. (Patient not taking: Reported on 01/10/2023)     levETIRAcetam  (KEPPRA ) 750 MG tablet Take 1 tablet (750 mg total) by mouth 2 (two) times daily. 180 tablet 3   methocarbamol  (ROBAXIN ) 500 MG tablet Take 1 tablet (500 mg total) by mouth every 6 (six) hours as needed for muscle spasms. (Patient not taking: Reported on 01/10/2023) 40 tablet 0   Omega-3 Fatty Acids (OMEGA 3 500) 500 MG CAPS Take 1 capsule by mouth daily. (Patient not taking: Reported on 12/30/2022)     potassium chloride  SA (K-DUR,KLOR-CON ) 20 MEQ tablet Take 20 mEq by mouth daily.     traMADol  (ULTRAM ) 50 MG tablet Take 1-2 tablets (50-100 mg total) by mouth every 6 (six) hours as needed for moderate pain. (Patient not taking: Reported on 01/10/2023) 40 tablet 0   Vitamin D, Ergocalciferol, (DRISDOL) 1.25 MG (50000 UNIT) CAPS capsule Take 50,000 Units by mouth every Monday.     zinc gluconate 50 MG tablet Take 50 mg by mouth daily. (Patient not taking: Reported on 12/30/2022)     No current facility-administered medications for this visit.    No Known Allergies   REVIEW OF SYSTEMS:  [X]  denotes positive finding, [ ]  denotes negative finding Cardiac  Comments:  Chest pain or chest pressure:    Shortness of breath upon exertion:    Short of breath when lying flat:    Irregular  heart rhythm:        Vascular    Pain in calf, thigh, or hip brought on by ambulation:    Pain in feet at night that wakes you up from your sleep:     Blood clot in your veins:    Leg swelling:         Pulmonary    Oxygen at home:    Productive cough:     Wheezing:         Neurologic    Sudden weakness in arms or legs:     Sudden numbness in arms or legs:     Sudden onset of difficulty speaking or slurred speech:    Temporary loss of vision in one eye:     Problems with dizziness:         Gastrointestinal    Blood in stool:     Vomited blood:         Genitourinary    Burning when urinating:     Blood in urine:        Psychiatric    Major depression:         Hematologic    Bleeding problems:    Problems with blood clotting too easily:        Skin    Rashes or ulcers:        Constitutional    Fever or chills:      PHYSICAL EXAMINATION:  There were no vitals filed for this visit.  General:  WDWN in NAD; vital signs documented above Gait: Not observed HENT: WNL, normocephalic Pulmonary: normal non-labored breathing , without wheezing Cardiac: regular HR Abdomen: soft, NT, no masses Skin: without rashes Vascular Exam/Pulses:  Right Left  Radial 2+ (normal) 2+ (normal)  Ulnar    Femoral    Popliteal    DP 2+ (normal) 2+ (normal)  PT     Extremities: without ischemic changes, without Gangrene , without cellulitis; without open wounds;  Musculoskeletal: no muscle wasting or atrophy  Neurologic: A&O X 3;  No focal weakness or paresthesias are detected Psychiatric:  The pt has Normal affect.   Non-Invasive Vascular Imaging:   Summary:   Right Carotid: There is no evidence of stenosis in the right ICA.   Left Carotid: Velocities in the left ICA are consistent with a 1-39%  stenosis.   Vertebrals: Bilateral vertebral arteries demonstrate antegrade flow.  Subclavians: Right subclavian artery was stenotic. Normal flow  hemodynamics were                seen in the left subclavian artery.     ASSESSMENT/PLAN: NALDA SHACKLEFORD is a 73 y.o. female presenting with history of right-sided stroke with left-sided arm and leg deficits. Imaging demonstrating right vertebral artery ostial stenosis.  Bilateral carotid arteries widely patent.  Basilar artery widely patent.  No vertebrobasilar symptoms.  Left-sided deficits resolved.  I had a long discussion with Ercelle regarding the above.  I do not think the right-sided vertebral artery stenosis is the culprit lesion which led to stroke.  She has severe small vessel disease, and possible culprit lesion in the right MCA in the M1 segment, with chronic lacunar infarcts and small scattered subacute infarcts in the right cerebral hemisphere.   I have no interventions to offer as the internal carotid arteries are widely patent.  She would benefit from neuro interventional radiology consult for the severe stenosis appreciated in the right MCA at the M1 segment.  I also plan to send a stat referral to my colleague, Dr. Annabella Scarce, for uncontrolled hypertension.  Temeca would benefit from continuing aspirin ,statin, and can follow-up with me as needed.  During her visit, we also discussed her systolic blood pressure greater than 200.  She is aware that a blood pressure this time is unsafe.  We discussed that she should be seen, by a physician in the emergency department, or at minimum her primary care today.  Fonda FORBES Rim, MD [facility] [phone]

## 2023-01-13 ENCOUNTER — Telehealth: Payer: Self-pay | Admitting: Neurology

## 2023-01-13 ENCOUNTER — Ambulatory Visit (INDEPENDENT_AMBULATORY_CARE_PROVIDER_SITE_OTHER): Payer: Medicare PPO | Admitting: Vascular Surgery

## 2023-01-13 ENCOUNTER — Encounter: Payer: Self-pay | Admitting: Vascular Surgery

## 2023-01-13 ENCOUNTER — Other Ambulatory Visit: Payer: Self-pay | Admitting: Neurology

## 2023-01-13 VITALS — BP 222/104 | HR 78 | Temp 98.2°F | Resp 20 | Ht 65.0 in | Wt 175.0 lb

## 2023-01-13 DIAGNOSIS — I10 Essential (primary) hypertension: Secondary | ICD-10-CM

## 2023-01-13 DIAGNOSIS — I6501 Occlusion and stenosis of right vertebral artery: Secondary | ICD-10-CM

## 2023-01-13 DIAGNOSIS — I6601 Occlusion and stenosis of right middle cerebral artery: Secondary | ICD-10-CM

## 2023-01-13 DIAGNOSIS — G40209 Localization-related (focal) (partial) symptomatic epilepsy and epileptic syndromes with complex partial seizures, not intractable, without status epilepticus: Secondary | ICD-10-CM

## 2023-01-13 MED ORDER — CARBAMAZEPINE ER 300 MG PO CP12: ORAL_CAPSULE | ORAL | 1 refills | Status: DC

## 2023-01-13 NOTE — Telephone Encounter (Signed)
 Refill sent [facility] for [facility] pt

## 2023-01-13 NOTE — Telephone Encounter (Signed)
 Pt is requesting a refill for carbamazepine (CARBATROL) 300 MG 12 hr capsule .  Pharmacy: [facility]

## 2023-01-14 ENCOUNTER — Telehealth: Payer: Self-pay

## 2023-01-14 NOTE — Telephone Encounter (Signed)
 Pt called following up on referral to Cardiology (Dr. Chilton Si) from Dr. Karin Lieu.   Pt confirmed understanding that the referral is in process and she can call next week if no one has called her with the appointment.

## 2023-01-18 DIAGNOSIS — H401123 Primary open-angle glaucoma, left eye, severe stage: Secondary | ICD-10-CM | POA: Diagnosis not present

## 2023-01-18 DIAGNOSIS — H401111 Primary open-angle glaucoma, right eye, mild stage: Secondary | ICD-10-CM | POA: Diagnosis not present

## 2023-01-19 DIAGNOSIS — I69392 Facial weakness following cerebral infarction: Secondary | ICD-10-CM | POA: Diagnosis not present

## 2023-01-19 DIAGNOSIS — R29898 Other symptoms and signs involving the musculoskeletal system: Secondary | ICD-10-CM | POA: Diagnosis not present

## 2023-01-19 DIAGNOSIS — E041 Nontoxic single thyroid nodule: Secondary | ICD-10-CM | POA: Diagnosis not present

## 2023-01-19 DIAGNOSIS — G40409 Other generalized epilepsy and epileptic syndromes, not intractable, without status epilepticus: Secondary | ICD-10-CM | POA: Diagnosis not present

## 2023-01-19 DIAGNOSIS — E785 Hyperlipidemia, unspecified: Secondary | ICD-10-CM | POA: Diagnosis not present

## 2023-01-19 DIAGNOSIS — R200 Anesthesia of skin: Secondary | ICD-10-CM | POA: Diagnosis not present

## 2023-01-19 DIAGNOSIS — I119 Hypertensive heart disease without heart failure: Secondary | ICD-10-CM | POA: Diagnosis not present

## 2023-01-19 DIAGNOSIS — I69398 Other sequelae of cerebral infarction: Secondary | ICD-10-CM | POA: Diagnosis not present

## 2023-01-19 DIAGNOSIS — I69322 Dysarthria following cerebral infarction: Secondary | ICD-10-CM | POA: Diagnosis not present

## 2023-01-20 ENCOUNTER — Telehealth: Payer: Self-pay

## 2023-01-20 NOTE — Telephone Encounter (Signed)
 Patient called requesting an earlier appoint for Dr. Chilton Si, that she has an appointment for April. Noted appointment was for March 10.   Returned call with no answer and full voicemail

## 2023-01-20 NOTE — Telephone Encounter (Signed)
 Returned call.  No anwser/voicemail full

## 2023-02-01 DIAGNOSIS — I639 Cerebral infarction, unspecified: Secondary | ICD-10-CM | POA: Diagnosis not present

## 2023-02-02 DIAGNOSIS — I471 Supraventricular tachycardia, unspecified: Secondary | ICD-10-CM | POA: Diagnosis not present

## 2023-02-03 ENCOUNTER — Ambulatory Visit: Payer: Medicare PPO | Admitting: Gastroenterology

## 2023-02-03 ENCOUNTER — Encounter: Payer: Self-pay | Admitting: Gastroenterology

## 2023-02-04 DIAGNOSIS — G40409 Other generalized epilepsy and epileptic syndromes, not intractable, without status epilepticus: Secondary | ICD-10-CM | POA: Diagnosis not present

## 2023-02-04 DIAGNOSIS — R200 Anesthesia of skin: Secondary | ICD-10-CM | POA: Diagnosis not present

## 2023-02-04 DIAGNOSIS — E785 Hyperlipidemia, unspecified: Secondary | ICD-10-CM | POA: Diagnosis not present

## 2023-02-04 DIAGNOSIS — I69392 Facial weakness following cerebral infarction: Secondary | ICD-10-CM | POA: Diagnosis not present

## 2023-02-04 DIAGNOSIS — I69322 Dysarthria following cerebral infarction: Secondary | ICD-10-CM | POA: Diagnosis not present

## 2023-02-04 DIAGNOSIS — E041 Nontoxic single thyroid nodule: Secondary | ICD-10-CM | POA: Diagnosis not present

## 2023-02-04 DIAGNOSIS — R29898 Other symptoms and signs involving the musculoskeletal system: Secondary | ICD-10-CM | POA: Diagnosis not present

## 2023-02-04 DIAGNOSIS — I119 Hypertensive heart disease without heart failure: Secondary | ICD-10-CM | POA: Diagnosis not present

## 2023-02-04 DIAGNOSIS — I69398 Other sequelae of cerebral infarction: Secondary | ICD-10-CM | POA: Diagnosis not present

## 2023-02-07 DIAGNOSIS — Z299 Encounter for prophylactic measures, unspecified: Secondary | ICD-10-CM | POA: Diagnosis not present

## 2023-02-07 DIAGNOSIS — I69359 Hemiplegia and hemiparesis following cerebral infarction affecting unspecified side: Secondary | ICD-10-CM | POA: Diagnosis not present

## 2023-02-07 DIAGNOSIS — I10 Essential (primary) hypertension: Secondary | ICD-10-CM | POA: Diagnosis not present

## 2023-02-15 ENCOUNTER — Encounter: Payer: Medicare PPO | Admitting: Vascular Surgery

## 2023-03-01 ENCOUNTER — Other Ambulatory Visit (HOSPITAL_COMMUNITY): Payer: Self-pay | Admitting: Interventional Radiology

## 2023-03-01 ENCOUNTER — Telehealth (HOSPITAL_COMMUNITY): Payer: Self-pay

## 2023-03-01 DIAGNOSIS — I771 Stricture of artery: Secondary | ICD-10-CM

## 2023-03-01 NOTE — Telephone Encounter (Signed)
 Called to schedule consult w/Dr. Corliss Skains, no answer, no vm. AB

## 2023-03-21 ENCOUNTER — Encounter (HOSPITAL_BASED_OUTPATIENT_CLINIC_OR_DEPARTMENT_OTHER): Payer: Self-pay | Admitting: Cardiovascular Disease

## 2023-03-21 ENCOUNTER — Ambulatory Visit (INDEPENDENT_AMBULATORY_CARE_PROVIDER_SITE_OTHER): Payer: Medicare PPO | Admitting: Cardiovascular Disease

## 2023-03-21 VITALS — BP 158/80 | HR 70 | Ht 65.0 in | Wt 158.6 lb

## 2023-03-21 DIAGNOSIS — E7800 Pure hypercholesterolemia, unspecified: Secondary | ICD-10-CM | POA: Diagnosis not present

## 2023-03-21 DIAGNOSIS — I10 Essential (primary) hypertension: Secondary | ICD-10-CM

## 2023-03-21 DIAGNOSIS — E785 Hyperlipidemia, unspecified: Secondary | ICD-10-CM

## 2023-03-21 DIAGNOSIS — Z5181 Encounter for therapeutic drug level monitoring: Secondary | ICD-10-CM

## 2023-03-21 MED ORDER — SPIRONOLACTONE 25 MG PO TABS: 25.0000 mg | ORAL_TABLET | Freq: Every day | ORAL | 3 refills | Status: AC

## 2023-03-21 NOTE — Progress Notes (Signed)
 Advanced Hypertension Clinic Initial Assessment:    Date:  03/21/2023   ID:  Tracy Lawson, DOB Jan 18, 1950, MRN [id number]  PCP:  Kirstie Peri, MD  Cardiologist:  Prentice Docker, MD (Inactive)  Nephrologist:  Referring MD: Victorino Sparrow, MD   CC: Hypertension  History of Present Illness:    Tracy Lawson is a 73 y.o. female with a hx of hypertension, hyperlipidemia, right vertebral and MCA stenosis, seizures, and CVA here to establish care in the Advanced Hypertension Clinic.  Tracy Lawson was admitted 12/2022 with a stroke.  Blood pressure at the time was reportedly 174/76.  She presented with left hand numbness and dysarthria.  CTA of the head and neck reportedly showed extensive chronic small vessel ischemic changes of the white matter and basal ganglia.  There is also severe stenosis of the right vertebral artery.  There is moderate to severe stenosis of MCA M2 branch.  Medications at that time were listed as amlodipine 10 mg, clonidine 0.2 mg twice daily, aspirin, furosemide 40 mg twice daily, and atenolol 100 mg daily.  Ms. Simonich followed up with Dr. Sherral Hammers of Vascular surgery 01/2023.  At that clinic visit her blood pressure was 222/104 on amlodipine 10 mg, clonidine 0.3 mg twice daily, labetalol 200 mg twice daily not taking).  She was referred to advance hypertension clinic.  He did not feel that her right sided vertebral stenosis was the culprit which caused her stroke.  He did not recommend any interventions.  Discussed the use of AI scribe software for clinical note transcription with the patient, who gave verbal consent to proceed.  History of Present Illness   Tracy Lawson presents for management of high blood pressure. She is accompanied by her daughter.  Hypertension has been present since age 80 and has been difficult to control, with no period of consistent blood pressure control. She was previously on clonidine but discontinued it in December due to loss of  appetite. Currently, she is on amlodipine, labetalol, and hydralazine, with hydralazine taken twice daily. Home blood pressure readings are around 140/80 mmHg.  She experienced a stroke in December and has since been more diligent with her medication regimen. There are no residual symptoms from the stroke. Since the event, she has been eating out more frequently and has experienced unintentional weight loss from 190 lbs to 158 lbs.  No chest pain, pressure, or breathing difficulties. She feels sleepy upon waking and does not fall asleep easily during the day. Physical activity is minimal due to lack of interest. She tries to monitor her salt intake and consumes decaffeinated beverages. She uses Tylenol for pain and occasionally Aleve, along with fish oil and vitamin D supplements.  Her family history is significant for two sisters who died of strokes and a mother who had a heart attack in her 21s. She is unsure if any family members had early-onset hypertension.       Previous antihypertensives: Clonidine- no appetite    Past Medical History:  Diagnosis Date   Arthritis    Localization-related (focal) (partial) epilepsy and epileptic syndromes with complex partial seizures, without mention of intractable epilepsy    Pain in limb    Pure hypercholesterolemia 03/21/2023   Seizures (HCC)    Sleep disturbance, unspecified    Stroke [facility])    Unspecified essential hypertension     Past Surgical History:  Procedure Laterality Date   CESAREAN SECTION     x1   NO PAST SURGERIES  TOTAL HIP ARTHROPLASTY Right 10/08/2020   Procedure: TOTAL HIP ARTHROPLASTY ANTERIOR APPROACH;  Surgeon: Ollen Gross, MD;  Location: [facility] ORS;  Service: Orthopedics;  Laterality: Right;    Current Medications: Current Meds  Medication Sig   amLODipine (NORVASC) 10 MG tablet Take 10 mg by mouth daily.   atorvastatin (LIPITOR) 80 MG tablet Take 80 mg by mouth daily.   Calcium Carbonate-Vitamin D3 600-400  MG-UNIT TABS Take 1 tablet by mouth daily.   carbamazepine (CARBATROL) 300 MG 12 hr capsule TAKE 1 CAPSULE EVERY MORNING AND 2 CAPSULES AT BEDTIME.   clopidogrel (PLAVIX) 75 MG tablet Take 1 tablet (75 mg total) by mouth daily. Take one a day po .   hydrALAZINE (APRESOLINE) 100 MG tablet Take 100 mg by mouth 2 (two) times daily.   labetalol (NORMODYNE) 200 MG tablet Take 200 mg by mouth 2 (two) times daily.   levETIRAcetam (KEPPRA) 750 MG tablet Take 1 tablet (750 mg total) by mouth 2 (two) times daily.   spironolactone (ALDACTONE) 25 MG tablet Take 1 tablet (25 mg total) by mouth daily.   Vitamin D, Ergocalciferol, (DRISDOL) 1.25 MG (50000 UNIT) CAPS capsule Take 50,000 Units by mouth every Monday.   [DISCONTINUED] atorvastatin (LIPITOR) 20 MG tablet Take 20 mg by mouth daily.   [DISCONTINUED] Cod Liver Oil 1000 MG CAPS Take 1,000 mg by mouth daily.   [DISCONTINUED] Omega-3 Fatty Acids (OMEGA 3 500) 500 MG CAPS Take 1 capsule by mouth daily.   [DISCONTINUED] potassium chloride SA (K-DUR,KLOR-CON) 20 MEQ tablet Take 20 mEq by mouth daily.   [DISCONTINUED] zinc gluconate 50 MG tablet Take 50 mg by mouth daily.     Allergies:   Patient has no known allergies.   Social History   Socioeconomic History   Marital status: Married    Spouse name: Chrissie Noa   Number of children: 2   Years of education: 14   Highest education level: Not on file  Occupational History   Not on file  Tobacco Use   Smoking status: Former    Current packs/day: 0.00    Types: Cigarettes    Quit date: 07/27/1992    Years since quitting: 30.6   Smokeless tobacco: Never  Vaping Use   Vaping status: Never Used  Substance and Sexual Activity   Alcohol use: No   Drug use: No   Sexual activity: Not Currently    Birth control/protection: None  Other Topics Concern   Not on file  Social History Narrative   Patient is married Chrissie Noa) and lives with her husband, foster children and adopted children.   Patient is  retired.   Patient has a college education.   Patient is right-handed.   Patient does not drink any caffeine.   Patient has two children.   Social Drivers of Corporate investment banker Strain: Low Risk  (12/21/2022)   Received from [facility]   Overall Financial Resource Strain (CARDIA)    Difficulty of Paying Living Expenses: Not hard at all  Food Insecurity: No Food Insecurity (12/21/2022)   [facility] from [facility]   Hunger Vital Sign    Worried About Running Out of Food in the Last Year: Never true    Ran Out of Food in the Last Year: Never true  Transportation Needs: No Transportation Needs (12/21/2022)   [facility] - Transportation    Lack of Transportation (Medical): No    Lack of Transportation (Non-Medical): No  Physical Activity: Insufficiently Active (12/21/2022)   Received from [facility]   Exercise Vital Sign    Days of Exercise per Week: 4 days    Minutes of Exercise per Session: 20 min  Stress: No Stress Concern Present (12/21/2022)   Received from [facility] - Occupational Stress Questionnaire    Feeling of Stress : Only a little  Social Connections: Moderately Integrated (12/21/2022)   Received from [facility]   Social Connection and Isolation Panel [NHANES]    Frequency of Communication with Friends and Family: More than three times a week    Frequency of Social Gatherings with Friends and Family: More than three times a week    Attends Religious Services: More than 4 times per year    Active Member of [employer] or Organizations: No    Attends Engineer, structural: Never    Marital Status: Married     Family History: The patient's family history includes Heart attack in her mother; Heart failure in her mother; Hypertension in her father and sister; Seizures in her maternal grandmother; Stroke in her father and sister.  ROS:   Please see the history of  present illness.     All other systems reviewed and are negative.  EKGs/Labs/Other Studies Reviewed:    EKG:  EKG is ordered today.    EKG Interpretation Date/Time:  Monday March 21 2023 09:28:34 EDT Ventricular Rate:  69 PR Interval:  158 QRS Duration:  82 QT Interval:  386 QTC Calculation: 413 R Axis:   48  Text Interpretation: Normal sinus rhythm Minimal voltage criteria for LVH, may be normal variant ( Sokolow-Lyon ) ST & T wave abnormality, consider inferolateral ischemia When compared with ECG of 25-Sep-2020 12:01, Inverted T waves have replaced nonspecific T wave abnormality in Inferior leads Confirmed by Chilton Si ([id number]) on 03/21/2023 9:36:39 AM         Echo 12/2022: Summary   1. The left ventricle is normal in size with mildly increased wall  thickness.   2. The left ventricular systolic function is normal, LVEF is visually  estimated at > 55%.    3. There is grade II diastolic dysfunction (elevated filling pressure).    4. The left atrium is moderately dilated in size.    5. The right ventricle is normal in size, with normal systolic function.    6. There is no evidence of an interatrial flow communication or  intrapulmonary shunt by agitated saline study.   Recent Labs: 08/25/2022: ALT 20; BUN 22; Creatinine, Ser 0.78; Hemoglobin 13.0; Platelets 215; Potassium 3.7; Sodium 145   Recent Lipid Panel No results found for: "CHOL", "TRIG", "HDL", "CHOLHDL", "VLDL", "LDLCALC", "LDLDIRECT"  Physical Exam:   VS:  BP (!) 158/80 (BP Location: Right Arm)   Pulse 70   Ht 5\' 5"  (1.651 m)   Wt 158 lb 9.6 oz (71.9 kg)   SpO2 100%   BMI 26.39 kg/m  , BMI Body mass index is 26.39 kg/m. GENERAL:  Well appearing HEENT: Pupils equal round and reactive, fundi not visualized, oral mucosa unremarkable NECK:  No jugular venous distention, waveform within normal limits, carotid upstroke brisk and symmetric, no bruits, no thyromegaly LUNGS:  Clear to auscultation  bilaterally HEART:  RRR.  PMI not displaced or sustained,S1 and S2 within normal limits, no S3, no S4, no clicks, no rubs, III/VI systolic murmur at LUSB ABD:  Flat, positive bowel sounds normal in frequency  in pitch, no bruits, no rebound, no guarding, no midline pulsatile mass, no hepatomegaly, no splenomegaly EXT:  2 plus pulses throughout, no edema, no cyanosis no clubbing SKIN:  No rashes no nodules NEURO:  Cranial nerves II through XII grossly intact, motor grossly intact throughout PSYCH:  Cognitively intact, oriented to person place and time   ASSESSMENT/PLAN:    # Hyperlipidemia:  Check lipids/CMP today.  Atorvastatin was increased in the hospital to 80mg .  LDL goal <70.   # Hypertension Long-standing, difficult to control. Recent stroke likely related to uncontrolled hypertension. Potential secondary causes include renal artery stenosis and hyperaldosteronism. Emphasized importance of blood pressure control to prevent complications. - Order renal artery Dopplers - Check renin and aldosterone today.  - Initiate spironolactone 25mg  daily.  Check BMP in 1 week.  Potassium has been borderline low.  - Continue amlodipine and labetalol. - Encourage dietary sodium reduction.  2300mg  daily.  - Recommend regular exercise.  She declined [facility] PREP program at the [facility].  Will go to [employer].  - Higher education careers adviser.  # Stroke Occurred December 2024, likely due to uncontrolled hypertension. No residual symptoms. Emphasized strict blood pressure and cholesterol control to prevent future strokes. - Monitor blood pressure closely. - Ensure cholesterol control. - Continue atorvastatin 80 mg daily.  # Carotid and Intracranial Artery Stenosis Narrowing identified, managed conservatively with medication. Emphasized blood pressure and cholesterol control to prevent progression. - Monitor blood pressure and cholesterol levels. - Continue current medication regimen.  #  Unintentional Weight Loss Weight loss from 190 lbs to 158 lbs without associated symptoms. Further evaluation if weight loss continues. - Monitor weight and nutritional status. - Encourage balanced diet and regular meals. - Keep colonoscopy f/u as scheduled.     # Murmur:  Mild LVH on echo 12/2022.  No valvular heart disease or LVOT obstruction.    Screening for Secondary Hypertension:     03/21/2023    9:40 AM  Causes  Drugs/Herbals Screened     - Comments Eats out.  No caffeine.  No EtOH.  +Aleve rare.  Renovascular HTN Screened     - Comments check renal artery Dopplers  Sleep Apnea Screened     - Comments Snores.  No apnea.  +Daytime somnolence.  Thyroid Disease Screened  Hyperaldosteronism Screened     - Comments check renin/aldo  Pheochromocytoma N/A  Cushing's Syndrome N/A  Hyperparathyroidism Screened  Coarctation of the Aorta Screened     - Comments BP symmetric  Compliance Screened    Relevant Labs/Studies:    Latest Ref Rng & Units 08/25/2022   11:04 AM 08/19/2021    2:12 PM 10/09/2020    3:16 AM  Basic Labs  Sodium 134 - 144 mmol/L 145  141  135   Potassium 3.5 - 5.2 mmol/L 3.7  4.2  3.2   Creatinine 0.57 - 1.00 mg/dL 1.61  0.96  0.45                    03/21/2023   10:16 AM  Renovascular   Renal Artery [place] Completed Yes     Disposition:    FU with MD/PharmD in 2 months    Medication Adjustments/Labs and Tests Ordered: Current medicines are reviewed at length with the patient today.  Concerns regarding medicines are outlined above.  Orders Placed This Encounter  Procedures   Aldosterone + renin activity w/ ratio   Lipid panel   Comprehensive metabolic panel   Basic metabolic panel  EKG 12-Lead   VAS US RENAL ARTERY DUPLEX   Meds ordered this encounter  Medications   spironolactone (ALDACTONE) 25 MG tablet    Sig: Take 1 tablet (25 mg total) by mouth daily.    Dispense:  90 tablet    Refill:  3     Signed, Chilton Si, MD   03/21/2023 10:36 AM    [facility]

## 2023-03-21 NOTE — Patient Instructions (Addendum)
 Medication Instructions:  START SPIRONOLACTONE 25 MG DAILY   Labwork: LP/CMET/RENIN/ALDOSTERONE TODAY   BMET IN 1 WEEK   Testing/Procedures: Your physician has requested that you have a renal artery duplex. During this test, an ultrasound is used to evaluate blood flow to the kidneys. Allow one hour for this exam. Do not eat after midnight the day before and avoid carbonated beverages. Take your medications as you usually do.  Follow-Up: 1 TO 2 MONTHS WITH ADV HTN CLINIC   Any Other Special Instructions Will Be Listed Below (If Applicable). MONITOR BLOOD PRESSURE DAILY AT HOME. BRING READINGS AND MACHINE TO FOLLOW UP   If you need a refill on your cardiac medications before your next appointment, please call your pharmacy.

## 2023-03-28 LAB — COMPREHENSIVE METABOLIC PANEL
ALT: 16 IU/L (ref 0–32)
AST: 21 IU/L (ref 0–40)
Albumin: 4.3 g/dL (ref 3.8–4.8)
Alkaline Phosphatase: 116 IU/L (ref 44–121)
BUN/Creatinine Ratio: 25 (ref 12–28)
BUN: 16 mg/dL (ref 8–27)
Bilirubin Total: 0.3 mg/dL (ref 0.0–1.2)
CO2: 20 mmol/L (ref 20–29)
Calcium: 9.4 mg/dL (ref 8.7–10.3)
Chloride: 108 mmol/L — ABNORMAL HIGH (ref 96–106)
Creatinine, Ser: 0.65 mg/dL (ref 0.57–1.00)
Globulin, Total: 3.2 g/dL (ref 1.5–4.5)
Glucose: 126 mg/dL — ABNORMAL HIGH (ref 70–99)
Potassium: 3.4 mmol/L — ABNORMAL LOW (ref 3.5–5.2)
Sodium: 147 mmol/L — ABNORMAL HIGH (ref 134–144)
Total Protein: 7.5 g/dL (ref 6.0–8.5)
eGFR: 94 mL/min/{1.73_m2} (ref >59)

## 2023-03-28 LAB — LIPID PANEL
Chol/HDL Ratio: 2.9 ratio (ref 0.0–4.4)
Cholesterol, Total: 175 mg/dL (ref 100–199)
HDL: 61 mg/dL (ref >39)
LDL Chol Calc (NIH): 101 mg/dL — ABNORMAL HIGH (ref 0–99)
Triglycerides: 69 mg/dL (ref 0–149)
VLDL Cholesterol Cal: 13 mg/dL (ref 5–40)

## 2023-03-28 LAB — ALDOSTERONE + RENIN ACTIVITY W/ RATIO
Aldos/Renin Ratio: >24.6 (ref 0.0–30.0)
Aldosterone: 4.1 ng/dL (ref 0.0–30.0)
Renin Activity, Plasma: <0.167 ng/mL/h — ABNORMAL LOW (ref 0.167–5.380)

## 2023-03-30 DIAGNOSIS — I10 Essential (primary) hypertension: Secondary | ICD-10-CM | POA: Diagnosis not present

## 2023-03-30 DIAGNOSIS — Z5181 Encounter for therapeutic drug level monitoring: Secondary | ICD-10-CM | POA: Diagnosis not present

## 2023-03-30 LAB — BASIC METABOLIC PANEL
BUN/Creatinine Ratio: 19 (ref 12–28)
BUN: 14 mg/dL (ref 8–27)
CO2: 25 mmol/L (ref 20–29)
Calcium: 9.4 mg/dL (ref 8.7–10.3)
Chloride: 103 mmol/L (ref 96–106)
Creatinine, Ser: 0.75 mg/dL (ref 0.57–1.00)
Glucose: 107 mg/dL — ABNORMAL HIGH (ref 70–99)
Potassium: 4.1 mmol/L (ref 3.5–5.2)
Sodium: 142 mmol/L (ref 134–144)
eGFR: 85 mL/min/{1.73_m2} (ref >59)

## 2023-04-01 ENCOUNTER — Other Ambulatory Visit (HOSPITAL_COMMUNITY): Payer: Self-pay

## 2023-04-01 ENCOUNTER — Telehealth (HOSPITAL_BASED_OUTPATIENT_CLINIC_OR_DEPARTMENT_OTHER): Payer: Self-pay | Admitting: *Deleted

## 2023-04-01 ENCOUNTER — Telehealth: Payer: Self-pay | Admitting: Pharmacy Technician

## 2023-04-01 DIAGNOSIS — E785 Hyperlipidemia, unspecified: Secondary | ICD-10-CM

## 2023-04-01 DIAGNOSIS — I10 Essential (primary) hypertension: Secondary | ICD-10-CM

## 2023-04-01 DIAGNOSIS — Z5181 Encounter for therapeutic drug level monitoring: Secondary | ICD-10-CM

## 2023-04-01 MED ORDER — REPATHA SURECLICK 140 MG/ML ~~LOC~~ SOAJ: 140.0000 mg | SUBCUTANEOUS | 3 refills | Status: DC

## 2023-04-01 NOTE — Telephone Encounter (Signed)
 Pharmacy Patient Advocate Encounter  Received notification from [facility] that Prior Authorization for repatha has been APPROVED from 01/12/23 to 01/11/24. Spoke to pharmacy to process.Copay is $40.00.    PA #/Case ID/Reference #: [id number]

## 2023-04-01 NOTE — Telephone Encounter (Signed)
 Advised patient or recommendation  She is not sure she will be comfortable giving herself injections  Advised to have her and her daughter to watch video, even offered her to come into the office for nurse to show or Pharm D appointment  Patient will watch video and let us know if she will not be able to administer.  Advised if not able to administer will get her in to see Pharm D  Patient agreeable to plan   Will forward to PA department for review

## 2023-04-01 NOTE — Telephone Encounter (Signed)
-----   Message from [facility] sent at 04/01/2023 10:10 AM EDT ----- Kidney function is normal and potassium has normalized.  Labs are not consistent with hyperaldosteronism causing her difficult to control blood pressures.  Given her history of stroke her cholesterol needs to be better controlled.  In addition to the atorvastatin, recommend that we add Repatha weekly.  Repeat lipids and a CMP in 2 to 3 months.  This should help her LDL to be less than 70.

## 2023-04-07 ENCOUNTER — Telehealth: Payer: Self-pay | Admitting: Cardiovascular Disease

## 2023-04-07 NOTE — Telephone Encounter (Signed)
 Pt c/o medication issue:  1. Name of Medication: Evolocumab (REPATHA SURECLICK) 140 MG/ML SOAJ   2. How are you currently taking this medication (dosage and times per day)? As written   3. Are you having a reaction (difficulty breathing--STAT)? No   4. What is your medication issue? Pt is calling back about the prior auth and wants to discuss her ins

## 2023-04-07 NOTE — Telephone Encounter (Signed)
 Returned call to patient,   Answered repatha questions

## 2023-04-07 NOTE — Telephone Encounter (Signed)
*  STAT* If patient is at the pharmacy, call can be transferred to refill team.   1. Which medications need to be refilled? (please list name of each medication and dose if known) Evolocumab (REPATHA SURECLICK) 140 MG/ML SOAJ   2. Which pharmacy/location (including street and city if local pharmacy) is medication to be sent to?  [id number] - [place], [place] - [address]      3. Do they need a 30 day or 90 day supply? 90 day    Pt stated [facility] doesn't carry this medication so [facility].

## 2023-04-08 MED ORDER — REPATHA SURECLICK 140 MG/ML ~~LOC~~ SOAJ: 140.0000 mg | SUBCUTANEOUS | 3 refills | Status: AC

## 2023-04-11 ENCOUNTER — Institutional Professional Consult (permissible substitution): Payer: Medicare PPO | Admitting: Neurology

## 2023-04-19 ENCOUNTER — Telehealth (HOSPITAL_BASED_OUTPATIENT_CLINIC_OR_DEPARTMENT_OTHER): Payer: Self-pay | Admitting: Cardiovascular Disease

## 2023-04-19 DIAGNOSIS — H401111 Primary open-angle glaucoma, right eye, mild stage: Secondary | ICD-10-CM | POA: Diagnosis not present

## 2023-04-19 DIAGNOSIS — Z961 Presence of intraocular lens: Secondary | ICD-10-CM | POA: Diagnosis not present

## 2023-04-19 DIAGNOSIS — H401123 Primary open-angle glaucoma, left eye, severe stage: Secondary | ICD-10-CM | POA: Diagnosis not present

## 2023-04-19 NOTE — Telephone Encounter (Signed)
 Noted. Nothing further needed at this time. Removing from triage

## 2023-04-19 NOTE — Telephone Encounter (Signed)
 Patient canceled Renal test she stated due to family emergency.  Patient stated she will call back to reschedule.

## 2023-04-20 ENCOUNTER — Encounter (HOSPITAL_BASED_OUTPATIENT_CLINIC_OR_DEPARTMENT_OTHER)

## 2023-04-22 DIAGNOSIS — I10 Essential (primary) hypertension: Secondary | ICD-10-CM | POA: Diagnosis not present

## 2023-04-22 DIAGNOSIS — Z7251 High risk heterosexual behavior: Secondary | ICD-10-CM | POA: Diagnosis not present

## 2023-04-22 DIAGNOSIS — R634 Abnormal weight loss: Secondary | ICD-10-CM | POA: Diagnosis not present

## 2023-05-09 DIAGNOSIS — R569 Unspecified convulsions: Secondary | ICD-10-CM | POA: Diagnosis not present

## 2023-05-09 DIAGNOSIS — Z0000 Encounter for general adult medical examination without abnormal findings: Secondary | ICD-10-CM | POA: Diagnosis not present

## 2023-05-09 DIAGNOSIS — E2839 Other primary ovarian failure: Secondary | ICD-10-CM | POA: Diagnosis not present

## 2023-05-09 DIAGNOSIS — Z1331 Encounter for screening for depression: Secondary | ICD-10-CM | POA: Diagnosis not present

## 2023-05-09 DIAGNOSIS — Z299 Encounter for prophylactic measures, unspecified: Secondary | ICD-10-CM | POA: Diagnosis not present

## 2023-05-09 DIAGNOSIS — Z1339 Encounter for screening examination for other mental health and behavioral disorders: Secondary | ICD-10-CM | POA: Diagnosis not present

## 2023-05-09 DIAGNOSIS — Z7189 Other specified counseling: Secondary | ICD-10-CM | POA: Diagnosis not present

## 2023-05-09 DIAGNOSIS — I10 Essential (primary) hypertension: Secondary | ICD-10-CM | POA: Diagnosis not present

## 2023-05-19 ENCOUNTER — Encounter (HOSPITAL_BASED_OUTPATIENT_CLINIC_OR_DEPARTMENT_OTHER)

## 2023-05-20 LAB — COLOGUARD

## 2023-05-26 ENCOUNTER — Encounter (HOSPITAL_BASED_OUTPATIENT_CLINIC_OR_DEPARTMENT_OTHER): Admitting: Family

## 2023-06-03 DIAGNOSIS — Z1231 Encounter for screening mammogram for malignant neoplasm of breast: Secondary | ICD-10-CM | POA: Diagnosis not present

## 2023-06-13 ENCOUNTER — Ambulatory Visit (HOSPITAL_BASED_OUTPATIENT_CLINIC_OR_DEPARTMENT_OTHER)

## 2023-06-13 DIAGNOSIS — I10 Essential (primary) hypertension: Secondary | ICD-10-CM | POA: Diagnosis not present

## 2023-06-17 ENCOUNTER — Ambulatory Visit: Payer: Self-pay | Admitting: Cardiovascular Disease

## 2023-06-22 DIAGNOSIS — R040 Epistaxis: Secondary | ICD-10-CM | POA: Diagnosis not present

## 2023-06-22 DIAGNOSIS — Z79899 Other long term (current) drug therapy: Secondary | ICD-10-CM | POA: Diagnosis not present

## 2023-06-22 DIAGNOSIS — Z87891 Personal history of nicotine dependence: Secondary | ICD-10-CM | POA: Diagnosis not present

## 2023-06-22 DIAGNOSIS — Z7901 Long term (current) use of anticoagulants: Secondary | ICD-10-CM | POA: Diagnosis not present

## 2023-06-22 DIAGNOSIS — W06XXXA Fall from bed, initial encounter: Secondary | ICD-10-CM | POA: Diagnosis not present

## 2023-06-22 DIAGNOSIS — Z7982 Long term (current) use of aspirin: Secondary | ICD-10-CM | POA: Diagnosis not present

## 2023-06-22 DIAGNOSIS — I10 Essential (primary) hypertension: Secondary | ICD-10-CM | POA: Diagnosis not present

## 2023-06-22 DIAGNOSIS — S0033XA Contusion of nose, initial encounter: Secondary | ICD-10-CM | POA: Diagnosis not present

## 2023-06-28 DIAGNOSIS — Z9181 History of falling: Secondary | ICD-10-CM | POA: Diagnosis not present

## 2023-06-28 DIAGNOSIS — I10 Essential (primary) hypertension: Secondary | ICD-10-CM | POA: Diagnosis not present

## 2023-06-28 DIAGNOSIS — E7800 Pure hypercholesterolemia, unspecified: Secondary | ICD-10-CM | POA: Diagnosis not present

## 2023-06-28 DIAGNOSIS — I69359 Hemiplegia and hemiparesis following cerebral infarction affecting unspecified side: Secondary | ICD-10-CM | POA: Diagnosis not present

## 2023-06-28 DIAGNOSIS — Z299 Encounter for prophylactic measures, unspecified: Secondary | ICD-10-CM | POA: Diagnosis not present

## 2023-07-04 DIAGNOSIS — Z79899 Other long term (current) drug therapy: Secondary | ICD-10-CM | POA: Diagnosis not present

## 2023-07-04 DIAGNOSIS — S96911A Strain of unspecified muscle and tendon at ankle and foot level, right foot, initial encounter: Secondary | ICD-10-CM | POA: Diagnosis not present

## 2023-07-04 DIAGNOSIS — Z7982 Long term (current) use of aspirin: Secondary | ICD-10-CM | POA: Diagnosis not present

## 2023-07-04 DIAGNOSIS — E785 Hyperlipidemia, unspecified: Secondary | ICD-10-CM | POA: Diagnosis not present

## 2023-07-04 DIAGNOSIS — W06XXXA Fall from bed, initial encounter: Secondary | ICD-10-CM | POA: Diagnosis not present

## 2023-07-04 DIAGNOSIS — Z7902 Long term (current) use of antithrombotics/antiplatelets: Secondary | ICD-10-CM | POA: Diagnosis not present

## 2023-07-04 DIAGNOSIS — I10 Essential (primary) hypertension: Secondary | ICD-10-CM | POA: Diagnosis not present

## 2023-07-04 DIAGNOSIS — M85871 Other specified disorders of bone density and structure, right ankle and foot: Secondary | ICD-10-CM | POA: Diagnosis not present

## 2023-07-04 DIAGNOSIS — S93401A Sprain of unspecified ligament of right ankle, initial encounter: Secondary | ICD-10-CM | POA: Diagnosis not present

## 2023-07-04 DIAGNOSIS — Z87891 Personal history of nicotine dependence: Secondary | ICD-10-CM | POA: Diagnosis not present

## 2023-07-04 DIAGNOSIS — M25571 Pain in right ankle and joints of right foot: Secondary | ICD-10-CM | POA: Diagnosis not present

## 2023-07-04 DIAGNOSIS — M7731 Calcaneal spur, right foot: Secondary | ICD-10-CM | POA: Diagnosis not present

## 2023-07-04 DIAGNOSIS — S93601A Unspecified sprain of right foot, initial encounter: Secondary | ICD-10-CM | POA: Diagnosis not present

## 2023-07-09 ENCOUNTER — Encounter (HOSPITAL_COMMUNITY): Payer: Self-pay | Admitting: Interventional Radiology

## 2023-08-04 DIAGNOSIS — I69359 Hemiplegia and hemiparesis following cerebral infarction affecting unspecified side: Secondary | ICD-10-CM | POA: Diagnosis not present

## 2023-08-04 DIAGNOSIS — R634 Abnormal weight loss: Secondary | ICD-10-CM | POA: Diagnosis not present

## 2023-08-04 DIAGNOSIS — R569 Unspecified convulsions: Secondary | ICD-10-CM | POA: Diagnosis not present

## 2023-08-04 DIAGNOSIS — R7303 Prediabetes: Secondary | ICD-10-CM | POA: Diagnosis not present

## 2023-08-04 DIAGNOSIS — Z299 Encounter for prophylactic measures, unspecified: Secondary | ICD-10-CM | POA: Diagnosis not present

## 2023-08-04 DIAGNOSIS — R7309 Other abnormal glucose: Secondary | ICD-10-CM | POA: Diagnosis not present

## 2023-08-04 DIAGNOSIS — I10 Essential (primary) hypertension: Secondary | ICD-10-CM | POA: Diagnosis not present

## 2023-08-16 DIAGNOSIS — Z961 Presence of intraocular lens: Secondary | ICD-10-CM | POA: Diagnosis not present

## 2023-08-16 DIAGNOSIS — H401111 Primary open-angle glaucoma, right eye, mild stage: Secondary | ICD-10-CM | POA: Diagnosis not present

## 2023-08-16 DIAGNOSIS — H401123 Primary open-angle glaucoma, left eye, severe stage: Secondary | ICD-10-CM | POA: Diagnosis not present

## 2023-08-18 DIAGNOSIS — R350 Frequency of micturition: Secondary | ICD-10-CM | POA: Diagnosis not present

## 2023-08-18 DIAGNOSIS — N393 Stress incontinence (female) (male): Secondary | ICD-10-CM | POA: Diagnosis not present

## 2023-08-18 DIAGNOSIS — N958 Other specified menopausal and perimenopausal disorders: Secondary | ICD-10-CM | POA: Diagnosis not present

## 2023-08-18 DIAGNOSIS — R300 Dysuria: Secondary | ICD-10-CM | POA: Diagnosis not present

## 2023-08-26 ENCOUNTER — Other Ambulatory Visit: Payer: Self-pay | Admitting: Adult Health

## 2023-08-26 DIAGNOSIS — G40209 Localization-related (focal) (partial) symptomatic epilepsy and epileptic syndromes with complex partial seizures, not intractable, without status epilepticus: Secondary | ICD-10-CM

## 2023-08-30 ENCOUNTER — Telehealth: Payer: Medicare PPO | Admitting: Adult Health

## 2023-08-30 DIAGNOSIS — I63319 Cerebral infarction due to thrombosis of unspecified middle cerebral artery: Secondary | ICD-10-CM

## 2023-08-30 DIAGNOSIS — G40209 Localization-related (focal) (partial) symptomatic epilepsy and epileptic syndromes with complex partial seizures, not intractable, without status epilepticus: Secondary | ICD-10-CM | POA: Diagnosis not present

## 2023-08-30 MED ORDER — CARBAMAZEPINE ER 300 MG PO CP12: ORAL_CAPSULE | ORAL | 3 refills | Status: AC

## 2023-08-30 MED ORDER — LEVETIRACETAM 750 MG PO TABS
ORAL_TABLET | ORAL | 3 refills | Status: AC
Start: 2023-08-30 — End: ?

## 2023-08-30 NOTE — Progress Notes (Signed)
 PATIENT: Tracy Lawson DOB: June 13, 1950  REASON FOR VISIT: follow up HISTORY FROM: patient  Virtual Visit via Video Note  I connected with Avelina KATHEE Florence on 08/30/23 at  1:00 PM EDT by a video enabled telemedicine application located remotely at [facility] and verified that I am speaking with the correct person using two identifiers who was located at their own in [place]   I discussed the limitations of evaluation and management by telemedicine and the availability of in person appointments. The patient expressed understanding and agreed to proceed.   PATIENT: Tracy Lawson DOB: [birth year]  REASON FOR VISIT: follow up HISTORY FROM: patient  HISTORY OF PRESENT ILLNESS: Today 08/30/23   Tracy Lawson is a 73 y.o. female with a history of seizures. Returns today for follow-up.  She returns today for follow-up.  She remains on Keppra  and carbamazepine .  Denies any additional seizure events.  She did come in to the office in December and saw Dr. Chalice after having a stroke.  Dr. Chalice ordered TCD with bubble studies and referred her to interventional radiology the patient has not completed either one of these.  She remains on Plavix .  Is seeing Dr. Raford for blood pressure control.  Returns today for an evaluation.   HISTORY 08/25/22:   Tracy Lawson is a 73 y.o. female with a history of Seizures. Returns today for follow-up. Overall doing well. No seizures. Remains on Keppra  and Carbamazepine . Continues to operate a motor vehicle . Able to complete all ADLs independently. No change in gait or balance. Returns today for follow-up.    08/19/21: Tracy Lawson  is a 73 year old female with a history of seizures. She returns today for follow-up. She remains on Keppra  750 mg twice a day and carbamazepine  300 mg in the morning and 600 mg in the evening. No seizure events. Continues to drive. Able to complete ADLS independently. Didn't take clonidine  because it  makes her sleepy. Will take when she gets back home. Checks her BP at home and this morning it was 158/81. Not symptomatic today.   06/16/20: Tracy Lawson is a 73 year old female with a history of seizures.  She returns today for follow-up.  She remains on Keppra  750 mg twice a day and carbamazepine  300 mg in the morning and 600 mg in the evening.  She denies any seizure events.  Reports that she tolerates the medication well.  She operates a Librarian, academic without difficulty.  Blood pressure is elevated today she reports that she did not take her medication as it makes her drowsy.  She plans to take her medication as soon as she gets home.   05/14/19: Tracy Lawson is a 73 year old female with a history of seizures.  She returns today for follow-up.  She remains on Keppra  and carbamazepine .  She denies any seizure events.  Denies any changes with her gait or balance.  She operates a Librarian, academic.  She is able to complete all ADLs independently.  She lives at home with her spouse and 2 children.  She returns today for an evaluation.   HISTORY HPI: Is a long established patient of Dr. Lynwood Schmitz.   She was last seen in the office by my colleague, Dr. Buck on 07-27-12 after Dr. Evelina retirement. At the time of her presentation she had a fairly high blood pressure of 181/87 mmHg with a low pulse rate of 50. The patient is on clonidine ,  but her  main neurologic complaint is a long-standing seizure disorder which has been well controlled with medications. She is unaccompanied today as the seizures were described as simple partial at onset with some complex secondary generalization. Her first seizure was in 1993. The seizure occurred right after the birth of her second child, at 67 months gestational age. The pregnancy was complicated by toxemia and PRES. Initially treated with phenytoin she developed gum swelling and was switched to carbamazepine  under which he become very sleepy, ataxic unsteady in her gait. They were   seizures while  on the medication, but she describes as pronounced by myoclonic twitches and speech arrest. Under her current regimen of Keppra   and carbamazepine ,  she feels she's doing very well, she does however have a history of poorly controlled hypertension in the past. No aura , diplopia and no known hyponatremia.  She has no new complaints today. She denies any recent illness, aura, seizure, problems with her medication or side effects. There is a new entry to her family history , her sister died of a Stroke in decemeber [address] , at age 61, her younger sister had a stroke, but is cared for at home.     REVIEW OF SYSTEMS: Out of a complete 14 system review of symptoms, the patient complains only of the following symptoms, and all other reviewed systems are negative.  ALLERGIES: No Known Allergies  HOME MEDICATIONS: Outpatient Medications Prior to Visit  Medication Sig Dispense Refill   amLODipine  (NORVASC ) 10 MG tablet Take 10 mg by mouth daily.     atorvastatin  (LIPITOR) 80 MG tablet Take 80 mg by mouth daily.     Calcium  Carbonate-Vitamin D3 600-400 MG-UNIT TABS Take 1 tablet by mouth daily.     clopidogrel  (PLAVIX ) 75 MG tablet Take 1 tablet (75 mg total) by mouth daily. Take one a day po . 30 tablet 11   Evolocumab  (REPATHA  SURECLICK) 140 MG/ML SOAJ Inject 140 mg into the skin every 14 (fourteen) days. 6 mL 3   hydrALAZINE  (APRESOLINE ) 100 MG tablet Take 100 mg by mouth 2 (two) times daily.     labetalol  (NORMODYNE ) 200 MG tablet Take 200 mg by mouth 2 (two) times daily.     spironolactone  (ALDACTONE ) 25 MG tablet Take 1 tablet (25 mg total) by mouth daily. 90 tablet 3   Vitamin D, Ergocalciferol, (DRISDOL) 1.25 MG (50000 UNIT) CAPS capsule Take 50,000 Units by mouth every Monday.     carbamazepine  (CARBATROL ) 300 MG 12 hr capsule TAKE 1 CAPSULE EVERY MORNING AND 2 CAPSULES AT BEDTIME. 270 capsule 1   levETIRAcetam  (KEPPRA ) 750 MG tablet take 1 tablet (750 MILLIGRAM total) by mouth  2 (two) times daily. 60 tablet 0   No facility-administered medications prior to visit.    PAST MEDICAL HISTORY: Past Medical History:  Diagnosis Date   Arthritis    Localization-related (focal) (partial) epilepsy and epileptic syndromes with complex partial seizures, without mention of intractable epilepsy    Pain in limb    Pure hypercholesterolemia 03/21/2023   Seizures (HCC)    Sleep disturbance, unspecified    Stroke (HCC)    Unspecified essential hypertension     PAST SURGICAL HISTORY: Past Surgical History:  Procedure Laterality Date   CESAREAN SECTION     x1   NO PAST SURGERIES     TOTAL HIP ARTHROPLASTY Right 10/08/2020   Procedure: TOTAL HIP ARTHROPLASTY ANTERIOR APPROACH;  Surgeon: Melodi Lerner, MD;  Location: [facility];  Service: Orthopedics;  Laterality: Right;  FAMILY HISTORY: Family History  Problem Relation Age of Onset   Heart attack Mother    Heart failure Mother    Hypertension Father    Stroke Father    Hypertension Sister    Stroke Sister    Seizures Maternal Grandmother     SOCIAL HISTORY: Social History   Socioeconomic History   Marital status: Married    Spouse name: Elsie   Number of children: 2   Years of education: 14   Highest education level: Not on file  Occupational History   Not on file  Tobacco Use   Smoking status: Former    Current packs/day: 0.00    Types: Cigarettes    Quit date: 07/27/1992    Years since quitting: 31.1   Smokeless tobacco: Never  Vaping Use   Vaping status: Never Used  Substance and Sexual Activity   Alcohol  use: No   Drug use: No   Sexual activity: Not Currently    Birth control/protection: None  Other Topics Concern   Not on file  Social History Narrative   Patient is married Thermon) and lives with her husband, foster children and adopted children.   Patient is retired.   Patient has a college education.   Patient is right-handed.   Patient does not drink any caffeine.   Patient has  two children.   Social Drivers of Health   Financial Resource Strain: Patient Declined (06/22/2023)   Received from [facility]   Overall Financial Resource Strain (CARDIA)    How hard is it for you to pay for the very basics like food, housing, medical care, and heating?: Patient declined  Food Insecurity: Patient Declined (06/22/2023)   Received from [facility]   Hunger Vital Sign    Within the past 12 months, you worried that your food would run out before you got the money to buy more.: Patient declined    Within the past 12 months, the food you bought just didn't last and you didn't have money to get more.: Patient declined  Transportation Needs: No Transportation Needs (06/22/2023)   Received from [facility]   PRAPARE - Transportation    Lack of Transportation (Medical): No    Lack of Transportation (Non-Medical): No  Physical Activity: Inactive (06/22/2023)   Received from [facility]   Exercise Vital Sign    On average, how many days per week do you engage in moderate to strenuous exercise (like a brisk walk)?: 0 days    On average, how many minutes do you engage in exercise at this level?: 0 min  Stress: No Stress Concern Present (06/22/2023)   Received from [facility] - Occupational Stress Questionnaire    Do you feel stress - tense, restless, nervous, or anxious, or unable to sleep at night because your mind is troubled all the time - these days?: Not at all  Social Connections: Socially Integrated (06/22/2023)   Received from [facility]   Social Connection and Isolation Panel    In a typical week, how many times do you talk on the phone with family, friends, or neighbors?: Three times a week    How often do you get together with friends or relatives?: Once a week    How often do you attend church or religious services?: 1 to 4 times per year    Do you belong to any clubs or organizations such as church  groups,  unions, fraternal or athletic groups, or school groups?: Yes    How often do you attend meetings of the clubs or organizations you belong to?: Never    Are you married, widowed, divorced, separated, never married, or living with a partner?: Married  Intimate Partner Violence: Not At Risk (06/22/2023)   Received from [facility]   Humiliation, Afraid, Rape, and Kick questionnaire    Within the last year, have you been afraid of your partner or ex-partner?: No    Within the last year, have you been humiliated or emotionally abused in other ways by your partner or ex-partner?: No    Within the last year, have you been kicked, hit, slapped, or otherwise physically hurt by your partner or ex-partner?: No    Within the last year, have you been raped or forced to have any kind of sexual activity by your partner or ex-partner?: No      PHYSICAL EXAM Generalized: Well developed, in no acute distress   Neurological examination  Mentation: Alert oriented to time, place, history taking. Follows all commands speech and language fluent  DIAGNOSTIC DATA (LABS, IMAGING, TESTING) - I reviewed patient records, labs, notes, testing and imaging myself where available.  Lab Results  Component Value Date   WBC 4.8 08/25/2022   HGB 13.0 08/25/2022   HCT 39.1 08/25/2022   MCV 90 08/25/2022   PLT 215 08/25/2022      Component Value Date/Time   NA 142 03/30/2023 0954   K 4.1 03/30/2023 0954   CL 103 03/30/2023 0954   CO2 25 03/30/2023 0954   GLUCOSE 107 (H) 03/30/2023 0954   GLUCOSE 128 (H) 10/09/2020 0316   BUN 14 03/30/2023 0954   CREATININE 0.75 03/30/2023 0954   CALCIUM  9.4 03/30/2023 0954   PROT 7.5 03/21/2023 1032   ALBUMIN 4.3 03/21/2023 1032   AST 21 03/21/2023 1032   ALT 16 03/21/2023 1032   ALKPHOS 116 03/21/2023 1032   BILITOT 0.3 03/21/2023 1032   GFRNONAA >60 10/09/2020 0316   GFRAA 88 05/14/2019 0954   Lab Results  Component Value Date   CHOL 175 03/21/2023   HDL  61 03/21/2023   LDLCALC 101 (H) 03/21/2023   TRIG 69 03/21/2023   CHOLHDL 2.9 03/21/2023     ASSESSMENT AND PLAN 73 y.o. year old female  has a past medical history of Arthritis, Localization-related (focal) (partial) epilepsy and epileptic syndromes with complex partial seizures, without mention of intractable epilepsy, Pain in limb, Pure hypercholesterolemia (03/21/2023), Seizures (HCC), Sleep disturbance, unspecified, Stroke (HCC), and Unspecified essential hypertension. here with:  Seizures  History of stroke  Continue on carbamazepine  300 mg in the a.m. and 600 mg in the p.m. Continue on Keppra  750 mg twice a day Continue on Plavix  for stroke prevention Keep good control of blood pressure with goal less than 130/90, cholesterol LDL less than 70 and hemoglobin A1c less than 7%. We discussed her referral to interventional radiology she is amenable for us  to resend this referral. We also discussed that Dr. Chalice had ordered TCD with bubble studies.  She is also amenable for us  to resend this order. Follow-up in 6 months with Dr. Chalice or sooner if needed  * No order type specified * Meds ordered this encounter  Medications   carbamazepine  (CARBATROL ) 300 MG 12 hr capsule    Sig: TAKE 1 CAPSULE EVERY MORNING AND 2 CAPSULES AT BEDTIME.    Dispense:  270 capsule    Refill:  3  Supervising Provider:   AHERN, ANTONIA B [[id number]]   levETIRAcetam  (KEPPRA ) 750 MG tablet    Sig: take 1 tablet (750 MILLIGRAM total) by mouth 2 (two) times daily.    Dispense:  180 tablet    Refill:  3    NA    Supervising Provider:   INES ONETHA NOVAK [[id number]]     Duwaine Russell, MSN, NP-C 08/30/2023, 1:08 PM [facility] [address], [address] [place], [place] [address] [phone]   The patient's condition requires frequent monitoring and adjustments in the treatment plan, reflecting the ongoing complexity of care.  This provider is the continuing focal point for all  needed services for this condition.

## 2023-08-30 NOTE — Patient Instructions (Signed)
 Your Plan:  Continue carbamazepine  and Keppra  We will resend referral to interventional radiology to look at your vertebral artery Will recent order for transcranial Doppler ultrasound with bubble study     Thank you for coming to see us  at [facility]. I hope we have been able to provide you high quality care today.  You may receive a patient satisfaction survey over the next few weeks. We would appreciate your feedback and comments so that we may continue to improve ourselves and the health of our patients.

## 2023-08-31 ENCOUNTER — Telehealth: Payer: Self-pay | Admitting: Adult Health

## 2023-08-31 DIAGNOSIS — I63319 Cerebral infarction due to thrombosis of unspecified middle cerebral artery: Secondary | ICD-10-CM

## 2023-08-31 NOTE — Telephone Encounter (Signed)
 Pt has asked for a call from [place], NP re: Referral to a radiologist at [facility].  Pt was told she has to allow time for the referral to processed and for the coordinator to reach out to her.

## 2023-09-01 NOTE — Telephone Encounter (Signed)
 Pt called requesting a call from the provider regarding some questions that are being asked to her regarding the test she is to be getting. Please advise.

## 2023-09-01 NOTE — Telephone Encounter (Signed)
 From when the pt saw Duwaine, NP on 8-19 she was told a referral was being sent for her to see a radiologist at [facility].  Pt is asking for a call of confirmation that this is being worked on

## 2023-09-02 NOTE — Telephone Encounter (Signed)
I called the patient. LVM.

## 2023-09-04 NOTE — Progress Notes (Signed)
 Lets go the conservative route- no need for interventional Neuroradiology now that the intended physician has retired.   Everything else is risk factor control:  I like to see the patient 's BP as much controlled as possible, lipid/LDL will likely be followed by PCP and Dr Raford.  I think we can ask the patient to remain on ASA, and still follow yearly for the medications she takes for her seizures.

## 2023-09-05 NOTE — Telephone Encounter (Signed)
 I called and spoke to Ms. Szymczak.  I relayed that per [facility] NP and Dr. Chalice  since ordered tests12-30-2025- we may not have to do the interventional neuro -rad consult for angiogram anymore- her main risk factors are LDL and BP, this due to time ordered to now.  If per our stroke director, Dr Rosemarie for recommendation if he sees any benefit in a transcranial doppler, or if he agrees with conservative risk factor management we will let her know.  She appreciated call back.  She only remembers getting the carotid US  done.

## 2023-09-05 NOTE — Telephone Encounter (Signed)
 Contacted Interventional Radiology to cancel referral.

## 2023-09-05 NOTE — Addendum Note (Signed)
 Addended by: SHERRYL DUWAINE SQUIBB on: 09/05/2023 02:41 PM   Modules accepted: Orders

## 2023-10-04 DIAGNOSIS — Z0000 Encounter for general adult medical examination without abnormal findings: Secondary | ICD-10-CM | POA: Diagnosis not present

## 2023-10-04 DIAGNOSIS — E7800 Pure hypercholesterolemia, unspecified: Secondary | ICD-10-CM | POA: Diagnosis not present

## 2023-10-04 DIAGNOSIS — R5383 Other fatigue: Secondary | ICD-10-CM | POA: Diagnosis not present

## 2023-10-04 DIAGNOSIS — Z299 Encounter for prophylactic measures, unspecified: Secondary | ICD-10-CM | POA: Diagnosis not present

## 2023-10-04 DIAGNOSIS — Z23 Encounter for immunization: Secondary | ICD-10-CM | POA: Diagnosis not present

## 2023-10-04 DIAGNOSIS — Z1331 Encounter for screening for depression: Secondary | ICD-10-CM | POA: Diagnosis not present

## 2023-10-04 DIAGNOSIS — E559 Vitamin D deficiency, unspecified: Secondary | ICD-10-CM | POA: Diagnosis not present

## 2023-10-04 DIAGNOSIS — Z79899 Other long term (current) drug therapy: Secondary | ICD-10-CM | POA: Diagnosis not present

## 2023-10-04 DIAGNOSIS — I10 Essential (primary) hypertension: Secondary | ICD-10-CM | POA: Diagnosis not present

## 2023-10-14 DIAGNOSIS — E7800 Pure hypercholesterolemia, unspecified: Secondary | ICD-10-CM | POA: Diagnosis not present

## 2023-10-14 DIAGNOSIS — Z299 Encounter for prophylactic measures, unspecified: Secondary | ICD-10-CM | POA: Diagnosis not present

## 2023-10-14 DIAGNOSIS — I10 Essential (primary) hypertension: Secondary | ICD-10-CM | POA: Diagnosis not present

## 2023-10-14 DIAGNOSIS — R569 Unspecified convulsions: Secondary | ICD-10-CM | POA: Diagnosis not present

## 2023-10-14 DIAGNOSIS — I69359 Hemiplegia and hemiparesis following cerebral infarction affecting unspecified side: Secondary | ICD-10-CM | POA: Diagnosis not present

## 2023-11-10 DIAGNOSIS — Z299 Encounter for prophylactic measures, unspecified: Secondary | ICD-10-CM | POA: Diagnosis not present

## 2023-11-10 DIAGNOSIS — I10 Essential (primary) hypertension: Secondary | ICD-10-CM | POA: Diagnosis not present

## 2023-11-10 DIAGNOSIS — R7303 Prediabetes: Secondary | ICD-10-CM | POA: Diagnosis not present

## 2023-11-10 DIAGNOSIS — R350 Frequency of micturition: Secondary | ICD-10-CM | POA: Diagnosis not present

## 2023-11-23 DIAGNOSIS — H401111 Primary open-angle glaucoma, right eye, mild stage: Secondary | ICD-10-CM | POA: Diagnosis not present

## 2023-11-23 DIAGNOSIS — H401123 Primary open-angle glaucoma, left eye, severe stage: Secondary | ICD-10-CM | POA: Diagnosis not present

## 2023-11-23 DIAGNOSIS — Z961 Presence of intraocular lens: Secondary | ICD-10-CM | POA: Diagnosis not present

## 2023-11-24 DIAGNOSIS — Z961 Presence of intraocular lens: Secondary | ICD-10-CM | POA: Diagnosis not present

## 2023-11-24 DIAGNOSIS — H401111 Primary open-angle glaucoma, right eye, mild stage: Secondary | ICD-10-CM | POA: Diagnosis not present

## 2023-11-24 DIAGNOSIS — H401123 Primary open-angle glaucoma, left eye, severe stage: Secondary | ICD-10-CM | POA: Diagnosis not present

## 2023-11-30 DIAGNOSIS — H401111 Primary open-angle glaucoma, right eye, mild stage: Secondary | ICD-10-CM | POA: Diagnosis not present

## 2023-11-30 DIAGNOSIS — Z961 Presence of intraocular lens: Secondary | ICD-10-CM | POA: Diagnosis not present

## 2023-11-30 DIAGNOSIS — H401123 Primary open-angle glaucoma, left eye, severe stage: Secondary | ICD-10-CM | POA: Diagnosis not present

## 2023-12-23 ENCOUNTER — Other Ambulatory Visit: Payer: Self-pay | Admitting: Adult Health

## 2023-12-23 DIAGNOSIS — G40209 Localization-related (focal) (partial) symptomatic epilepsy and epileptic syndromes with complex partial seizures, not intractable, without status epilepticus: Secondary | ICD-10-CM

## 2023-12-26 ENCOUNTER — Other Ambulatory Visit: Payer: Self-pay

## 2023-12-26 MED ORDER — CLOPIDOGREL BISULFATE 75 MG PO TABS: 75.0000 mg | ORAL_TABLET | Freq: Every day | ORAL | 1 refills | Status: AC

## 2023-12-26 NOTE — Telephone Encounter (Signed)
 Last refilled by patient on : 12/05/23 Last office visit : 08/30/23 Next office visit : 02/08/24 Per last office visit - Continue on Plavix  for stroke prevention  I saw 2 other refill request have been denied please confirm patient refill request is appropriate.

## 2024-02-01 ENCOUNTER — Telehealth: Payer: Self-pay | Admitting: Adult Health

## 2024-02-01 DIAGNOSIS — G40209 Localization-related (focal) (partial) symptomatic epilepsy and epileptic syndromes with complex partial seizures, not intractable, without status epilepticus: Secondary | ICD-10-CM

## 2024-02-01 NOTE — Telephone Encounter (Signed)
 Pt called requesting a refill on her levETIRAcetam  (KEPPRA ) 750 MG tablet and her carbamazepine  (CARBATROL ) 300 MG 12 hr capsule and is needing them sent to [facility]

## 2024-02-01 NOTE — Telephone Encounter (Signed)
 Spoke to pharmacy getting rx ready for patient now.Pt aware

## 2024-02-07 ENCOUNTER — Telehealth: Payer: Self-pay | Admitting: Neurology

## 2024-02-07 NOTE — Telephone Encounter (Signed)
 Pt called  to Cancel appt due  weather and no transportation

## 2024-02-08 ENCOUNTER — Ambulatory Visit: Admitting: Neurology
